# Patient Record
Sex: Female | Born: 1935 | Race: White | Hispanic: No | Smoking: Former smoker
Health system: Southern US, Community
[De-identification: ages and names within clinical notes are randomized; demographics above are authoritative.]

## PROBLEM LIST (undated history)

## (undated) DIAGNOSIS — M519 Unspecified thoracic, thoracolumbar and lumbosacral intervertebral disc disorder: Secondary | ICD-10-CM

## (undated) DIAGNOSIS — M779 Enthesopathy, unspecified: Secondary | ICD-10-CM

## (undated) DIAGNOSIS — C801 Malignant (primary) neoplasm, unspecified: Secondary | ICD-10-CM

## (undated) DIAGNOSIS — I10 Essential (primary) hypertension: Secondary | ICD-10-CM

## (undated) DIAGNOSIS — F419 Anxiety disorder, unspecified: Secondary | ICD-10-CM

## (undated) DIAGNOSIS — F32A Depression, unspecified: Secondary | ICD-10-CM

## (undated) DIAGNOSIS — F329 Major depressive disorder, single episode, unspecified: Secondary | ICD-10-CM

## (undated) DIAGNOSIS — E039 Hypothyroidism, unspecified: Secondary | ICD-10-CM

## (undated) DIAGNOSIS — C4491 Basal cell carcinoma of skin, unspecified: Secondary | ICD-10-CM

## (undated) HISTORY — PX: TMJ ARTHROPLASTY: SHX1066

## (undated) HISTORY — PX: TONSILLECTOMY: SUR1361

---

## 2014-07-16 ENCOUNTER — Emergency Department (HOSPITAL_COMMUNITY)
Admission: EM | Admit: 2014-07-16 | Discharge: 2014-07-18 | Disposition: A | Discharge status: Home / Self Care | Payer: Medicare Other | Attending: Emergency Medicine | Admitting: Emergency Medicine

## 2014-07-16 ENCOUNTER — Encounter (HOSPITAL_COMMUNITY): Payer: Self-pay | Admitting: Emergency Medicine

## 2014-07-16 DIAGNOSIS — Z859 Personal history of malignant neoplasm, unspecified: Secondary | ICD-10-CM | POA: Insufficient documentation

## 2014-07-16 DIAGNOSIS — Z85828 Personal history of other malignant neoplasm of skin: Secondary | ICD-10-CM | POA: Insufficient documentation

## 2014-07-16 DIAGNOSIS — Z87891 Personal history of nicotine dependence: Secondary | ICD-10-CM | POA: Diagnosis not present

## 2014-07-16 DIAGNOSIS — R4182 Altered mental status, unspecified: Secondary | ICD-10-CM

## 2014-07-16 DIAGNOSIS — Z8739 Personal history of other diseases of the musculoskeletal system and connective tissue: Secondary | ICD-10-CM | POA: Insufficient documentation

## 2014-07-16 DIAGNOSIS — F0391 Unspecified dementia with behavioral disturbance: Secondary | ICD-10-CM | POA: Diagnosis not present

## 2014-07-16 DIAGNOSIS — Z049 Encounter for examination and observation for unspecified reason: Secondary | ICD-10-CM

## 2014-07-16 DIAGNOSIS — F29 Unspecified psychosis not due to a substance or known physiological condition: Secondary | ICD-10-CM | POA: Insufficient documentation

## 2014-07-16 DIAGNOSIS — F03918 Unspecified dementia, unspecified severity, with other behavioral disturbance: Secondary | ICD-10-CM | POA: Diagnosis present

## 2014-07-16 DIAGNOSIS — R443 Hallucinations, unspecified: Secondary | ICD-10-CM | POA: Diagnosis present

## 2014-07-16 HISTORY — DX: Basal cell carcinoma of skin, unspecified: C44.91

## 2014-07-16 HISTORY — DX: Enthesopathy, unspecified: M77.9

## 2014-07-16 HISTORY — DX: Unspecified thoracic, thoracolumbar and lumbosacral intervertebral disc disorder: M51.9

## 2014-07-16 HISTORY — DX: Hypothyroidism, unspecified: E03.9

## 2014-07-16 HISTORY — DX: Malignant (primary) neoplasm, unspecified: C80.1

## 2014-07-16 HISTORY — DX: Anxiety disorder, unspecified: F41.9

## 2014-07-16 HISTORY — DX: Major depressive disorder, single episode, unspecified: F32.9

## 2014-07-16 HISTORY — DX: Essential (primary) hypertension: I10

## 2014-07-16 HISTORY — DX: Depression, unspecified: F32.A

## 2014-07-16 LAB — COMPREHENSIVE METABOLIC PANEL
ALBUMIN: 3.6 g/dL (ref 3.5–5.2)
ALT: 9 U/L (ref 0–35)
AST: 12 U/L (ref 0–37)
Alkaline Phosphatase: 68 U/L (ref 39–117)
Anion gap: 14 (ref 5–15)
BILIRUBIN TOTAL: 0.2 mg/dL — AB (ref 0.3–1.2)
BUN: 26 mg/dL — AB (ref 6–23)
CALCIUM: 9.3 mg/dL (ref 8.4–10.5)
CHLORIDE: 98 meq/L (ref 96–112)
CO2: 21 mEq/L (ref 19–32)
CREATININE: 0.95 mg/dL (ref 0.50–1.10)
GFR calc Af Amer: 64 mL/min — ABNORMAL LOW (ref >90)
GFR calc non Af Amer: 55 mL/min — ABNORMAL LOW (ref >90)
Glucose, Bld: 121 mg/dL — ABNORMAL HIGH (ref 70–99)
Potassium: 4.3 mEq/L (ref 3.7–5.3)
Sodium: 133 mEq/L — ABNORMAL LOW (ref 137–147)
Total Protein: 6.9 g/dL (ref 6.0–8.3)

## 2014-07-16 LAB — CBC WITH DIFFERENTIAL/PLATELET
BASOS PCT: 1 % (ref 0–1)
Basophils Absolute: 0.1 10*3/uL (ref 0.0–0.1)
EOS ABS: 0.2 10*3/uL (ref 0.0–0.7)
EOS PCT: 2 % (ref 0–5)
HEMATOCRIT: 40.2 % (ref 36.0–46.0)
Hemoglobin: 13.8 g/dL (ref 12.0–15.0)
Lymphocytes Relative: 17 % (ref 12–46)
Lymphs Abs: 1.7 10*3/uL (ref 0.7–4.0)
MCH: 29.9 pg (ref 26.0–34.0)
MCHC: 34.3 g/dL (ref 30.0–36.0)
MCV: 87.2 fL (ref 78.0–100.0)
MONO ABS: 1.1 10*3/uL — AB (ref 0.1–1.0)
MONOS PCT: 11 % (ref 3–12)
NEUTROS ABS: 7.1 10*3/uL (ref 1.7–7.7)
Neutrophils Relative %: 69 % (ref 43–77)
Platelets: 359 10*3/uL (ref 150–400)
RBC: 4.61 MIL/uL (ref 3.87–5.11)
RDW: 12.5 % (ref 11.5–15.5)
WBC: 10.1 10*3/uL (ref 4.0–10.5)

## 2014-07-16 LAB — ETHANOL: Alcohol, Ethyl (B): <11 mg/dL (ref 0–11)

## 2014-07-16 MED ORDER — MELATONIN 3 MG PO TABS: 1.0000 | ORAL_TABLET | Freq: Every day | ORAL | Status: DC

## 2014-07-16 MED ORDER — DICLOFENAC SODIUM 75 MG PO TBEC
75.0000 mg | DELAYED_RELEASE_TABLET | Freq: Two times a day (BID) | ORAL | Status: DC
  Administered 2014-07-16 – 2014-07-18 (×4): 75 mg via ORAL
  Filled 2014-07-16 (×5): qty 1

## 2014-07-16 MED ORDER — METOPROLOL TARTRATE 25 MG PO TABS
100.0000 mg | ORAL_TABLET | Freq: Every day | ORAL | Status: DC
  Administered 2014-07-17 – 2014-07-18 (×2): 100 mg via ORAL
  Filled 2014-07-16 (×2): qty 4

## 2014-07-16 MED ORDER — SIMVASTATIN 40 MG PO TABS
40.0000 mg | ORAL_TABLET | Freq: Every day | ORAL | Status: DC
  Administered 2014-07-17 – 2014-07-18 (×2): 40 mg via ORAL
  Filled 2014-07-16 (×2): qty 1

## 2014-07-16 MED ORDER — FERROUS SULFATE 325 (65 FE) MG PO TABS
325.0000 mg | ORAL_TABLET | Freq: Two times a day (BID) | ORAL | Status: DC
  Administered 2014-07-17 – 2014-07-18 (×3): 325 mg via ORAL
  Filled 2014-07-16 (×5): qty 1

## 2014-07-16 MED ORDER — CYCLOSPORINE 0.05 % OP EMUL
1.0000 [drp] | Freq: Two times a day (BID) | OPHTHALMIC | Status: DC
  Administered 2014-07-16 – 2014-07-18 (×4): 1 [drp] via OPHTHALMIC
  Filled 2014-07-16 (×5): qty 1

## 2014-07-16 MED ORDER — CITALOPRAM HYDROBROMIDE 20 MG PO TABS
20.0000 mg | ORAL_TABLET | Freq: Every day | ORAL | Status: DC
  Administered 2014-07-17 – 2014-07-18 (×2): 20 mg via ORAL
  Filled 2014-07-16 (×2): qty 1

## 2014-07-16 MED ORDER — LEVOTHYROXINE SODIUM 50 MCG PO TABS
50.0000 ug | ORAL_TABLET | Freq: Every day | ORAL | Status: DC
  Administered 2014-07-17 – 2014-07-18 (×2): 50 ug via ORAL
  Filled 2014-07-16 (×3): qty 1

## 2014-07-16 MED ORDER — PANTOPRAZOLE SODIUM 40 MG PO TBEC
40.0000 mg | DELAYED_RELEASE_TABLET | Freq: Every day | ORAL | Status: DC
  Administered 2014-07-17 – 2014-07-18 (×2): 40 mg via ORAL
  Filled 2014-07-16 (×2): qty 1

## 2014-07-16 MED ORDER — ASPIRIN 325 MG PO TABS
325.0000 mg | ORAL_TABLET | Freq: Every day | ORAL | Status: DC
  Administered 2014-07-17 – 2014-07-18 (×2): 325 mg via ORAL
  Filled 2014-07-16 (×2): qty 1

## 2014-07-16 MED ORDER — RISPERIDONE 0.5 MG PO TABS
0.2500 mg | ORAL_TABLET | Freq: Two times a day (BID) | ORAL | Status: DC
  Administered 2014-07-16 – 2014-07-18 (×4): 0.25 mg via ORAL
  Filled 2014-07-16 (×4): qty 1

## 2014-07-16 NOTE — ED Notes (Signed)
Pt resting on stretcher at present, daughter just completed visit.  Pt awake, alert & responsive, no distress noted, will continue to monitor for safety.

## 2014-07-16 NOTE — Progress Notes (Signed)
PHARMACIST - PHYSICIAN ORDER COMMUNICATION  CONCERNING: P&T Medication Policy on Herbal Medications  DESCRIPTION:  This patient's order for:  melatonin has been noted.  This product(s) is classified as an "herbal" or natural product. Due to a lack of definitive safety studies or FDA approval, nonstandard manufacturing practices, plus the potential risk of unknown drug-drug interactions while on inpatient medications, the Pharmacy and Therapeutics Committee does not permit the use of "herbal" or natural products of this type within [facility].   ACTION TAKEN: The pharmacy department is unable to verify this order at this time and your patient has been informed of this safety policy. Please reevaluate patient's clinical condition at discharge and address if the herbal or natural product(s) should be resumed at that time.   Doreene Eland, PharmD, BCPS.   Pager: [phone] 07/16/2014 6:26 PM

## 2014-07-16 NOTE — Progress Notes (Addendum)
  CARE MANAGEMENT ED NOTE 07/16/2014  Patient:  Sarah Acevedo, Sarah Acevedo   Account Number:  [id number]  Date Initiated:  07/16/2014  Documentation initiated by:  Jackelyn Poling  Subjective/Objective Assessment:   80 yr old [place] Pt's daughter states pt is delusional and having auditory hallucinations starting this past summer, since which behavior has changed steadily, voices are causing constant fear.      Subjective/Objective Assessment Detail:   Pt was in ED in [place] last weekend because she was knocking on neighbors' doors telling neighbors her siblings had been killed in a car crash, ED told daughter that no organic cause was found for patient's behavior and ED recommended inpatient psych treatment. Pt's family member wanted to bring pt home, and did, took pt to PCP today, PCP recommended patient be taken to ED.    no pcp listed in EPIC but pt and daughter at bedside states pt saw Dr Stephanie Acre at [facility] [place] 07/16/14  pcp in [place] is [facility] in [place]  Pt was seen by [facility] provider in [place] recently  Pt has a neurologist in [place] who she sees for her aneurysms  [phone]  Daughter reports     Action/Plan:   ED CM spoke with pt and daughter at bedside Pt pleasant and cooperative Cm review medical clearance program processAnswered daughter questions about why pt requested to wear paper scrubs and gripper socks for her protection & staff. she   Action/Plan Detail:   voiced understanding EPIC updated   Anticipated DC Date:  07/16/2014     Status Recommendation to Physician:   Result of Recommendation:    Other ED [place]  Other  Outpatient Services - Pt will follow up  PCP issues    Choice offered to / List presented to:            Status of service:  Completed, signed off  ED Comments:   ED Comments Detail:     Neurologist is at [phone] and pt reports a new neurologist  assigned in [place] "closer"

## 2014-07-16 NOTE — ED Provider Notes (Signed)
CSN: [id number]     Arrival date & time 07/16/14  1409 History   First MD Initiated Contact with Patient 07/16/14 1524     Chief Complaint  Patient presents with  . Hallucinations   . Delusions      HPI  Patient presents with her daughter who provides much of the history of present illness. Patient herself denies physical complaints, does acknowledge paranoia. Patient was generally well until approximately 6 months ago.  Since about that time the patient has hadincreasingly frequent outbursts of verbal aggression, as well his changes consistent with paranoia. Patient frequently describes in the distal screw are not present, but seemingly threatening her. She also describes interactions with dead people. She has been hospitalized twice in the past month for these concerns, both times in [place]. The most recent evaluation was thorough, according to family members, and the patient was attempting to be placed in a facility. Family members live locally, and the patient is here now for additional evaluation. Level V caveat secondary to psychiatric condition.  Past Medical History  Diagnosis Date  . Cancer   . Ruptured disk   . Bone spur   . Basal cell carcinoma    Past Surgical History  Procedure Laterality Date  . Tmj arthroplasty    . Cesarean section    . Tonsillectomy     History reviewed. No pertinent family history. History  Substance Use Topics  . Smoking status: Former Research scientist (life sciences)  . Smokeless tobacco: Not on file  . Alcohol Use: No   OB History    No data available     Review of Systems  Unable to perform ROS: Psychiatric disorder      Allergies  Contrast media and Iodine  Home Medications   Prior to Admission medications   Not on File   BP 99/75 mmHg  Pulse 79  Temp(Src) 97.6 F (36.4 C) (Oral)  Resp 16  SpO2 98% Physical Exam  Constitutional: She is oriented to person, place, and time. She appears well-developed and well-nourished. No distress.   HENT:  Head: Normocephalic and atraumatic.  Eyes: Conjunctivae and EOM are normal.  Cardiovascular: Normal rate and regular rhythm.   Pulmonary/Chest: Effort normal and breath sounds normal. No stridor. No respiratory distress.  Abdominal: She exhibits no distension.  Musculoskeletal: She exhibits no edema.  Neurological: She is alert and oriented to person, place, and time. No cranial nerve deficit.  Skin: Skin is warm and dry.  Psychiatric: Her mood appears anxious. Her speech is delayed and tangential. She is withdrawn and actively hallucinating. Thought content is paranoid and delusional. Cognition and memory are impaired.  Nursing note and vitals reviewed.   ED Course  Procedures (including critical care time) Labs Review Labs Reviewed  CBC WITH DIFFERENTIAL - Abnormal; Notable for the following:    Monocytes Absolute 1.1 (*)    All other components within normal limits  COMPREHENSIVE METABOLIC PANEL - Abnormal; Notable for the following:    Sodium 133 (*)    Glucose, Bld 121 (*)    BUN 26 (*)    Total Bilirubin 0.2 (*)    GFR calc non Af Amer 55 (*)    GFR calc Af Amer 64 (*)    All other components within normal limits  ETHANOL  URINALYSIS, ROUTINE W REFLEX MICROSCOPIC    This elderly female presents to 2 familial concerns of delusional thoughts, paranoid behavior, as well as aggressive outbursts against family members.  After the initial evaluation we attempted  to obtain outside hospital records.  MDM  Well-appearing elderly female presents with new paranoid delusions, aggressive behavior. Patient is hemodynamically stable, awake and alert.  Patient has little insight into her behavior, which is described by family member. Patient was medically cleared for further evaluation and management by our psychiatry team. On transfer to our psychiatry holding area, the primary care physician was trying to obtain the patient's outside hospital records.    Carmin Muskrat,  MD 07/16/14 Tresa Moore
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
## 2014-07-16 NOTE — ED Notes (Signed)
Bed: WBH43 Expected date:  Expected time:  Means of arrival:  Comments: TR4 

## 2014-07-16 NOTE — Consult Note (Signed)
[facility] Face-to-Face Psychiatry Consult   Reason for Consult:  Psychosis  Referring Physician:  EDP Sarah Acevedo is an 80 y.o. female. Total Time spent with patient: 20 minutes  Assessment: AXIS I:  Psychotic Disorder NOS AXIS II:  Deferred AXIS III:   Past Medical History  Diagnosis Date  . Cancer   . Ruptured disk   . Bone spur   . Basal cell carcinoma   . Hypothyroidism   . Depression   . Anxiety   . Hypertension    AXIS IV:  other psychosocial or environmental problems and problems related to social environment AXIS V:  21-30 behavior considerably influenced by delusions or hallucinations OR serious impairment in judgment, communication OR inability to function in almost all areas  Plan:  Recommend psychiatric Inpatient admission when medically cleared.  Subjective:   Sarah Acevedo is a 80 y.o. female patient admitted with acute psychosis .  HPI:  Sarah Acevedo is a 80 year old caucasian female who presents to the emergency department with acute psychosis.  Patient is extremely paranoid and was secluding herself in her room due to fears that someone was in her home.  Patient endorses hearing voices telling her that people are dead.  Patient is gradiose as well as paranoid believing "my daughter spent 2 weeks with Sarah Acevedo"  Patient is concerned that her son in law is dead, and believes her grand daughter is in the intensive care unit where she was dead but brought back to life.  Patient has a long history of depression and has been hospitalized in the past for suicidal ideation.  Patient denies current suicidal ideation or homicidal ideation/ no substance abuse.  Patient was in the emergency department in [place] 1 week ago for similar symptoms and daughter states that no organic cause was found for the above symptoms.  Patient's mental status has been declining over the past six months, daughter brought patient to [place] to provide increased supervision and to find  psychiatric treatment for patient closer to her home.   HPI Elements:   Location:  generalized. Quality:  acute. Severity:  severe. Timing:  acute . Duration:  declining over past 6 months with acute exacerbation over past few weeks. Context:  stressors.  Past Psychiatric History: Past Medical History  Diagnosis Date  . Cancer   . Ruptured disk   . Bone spur   . Basal cell carcinoma   . Hypothyroidism   . Depression   . Anxiety   . Hypertension     reports that she has quit smoking. She does not have any smokeless tobacco history on file. She reports that she does not drink alcohol or use illicit drugs. History reviewed. No pertinent family history.         Allergies:   Allergies  Allergen Reactions  . Contrast Media [Iodinated Diagnostic Agents] Hives  . Iodine Hives    ACT Assessment Complete:  Yes:    Educational Status    Risk to Self: Risk to self with the past 6 months Is patient at risk for suicide?: No Substance abuse history and/or treatment for substance abuse?: No  Risk to Others:    Abuse:    Prior Inpatient Therapy:    Prior Outpatient Therapy:    Additional Information:                    Objective: Blood pressure 99/75, pulse 79, temperature 97.6 F (36.4 C), temperature source Oral, resp. rate 16, SpO2  98 %.There is no height or weight on file to calculate BMI. Results for orders placed or performed during the hospital encounter of 07/16/14 (from the past 72 hour(s))  CBC with Differential     Status: Abnormal   Collection Time: 07/16/14  4:13 PM  Result Value Ref Range   WBC 10.1 4.0 - 10.5 K/uL   RBC 4.61 3.87 - 5.11 MIL/uL   Hemoglobin 13.8 12.0 - 15.0 g/dL   HCT 40.2 36.0 - 46.0 %   MCV 87.2 78.0 - 100.0 fL   MCH 29.9 26.0 - 34.0 pg   MCHC 34.3 30.0 - 36.0 g/dL   RDW 12.5 11.5 - 15.5 %   Platelets 359 150 - 400 K/uL   Neutrophils Relative % 69 43 - 77 %   Neutro Abs 7.1 1.7 - 7.7 K/uL   Lymphocytes Relative 17 12 - 46 %    Lymphs Abs 1.7 0.7 - 4.0 K/uL   Monocytes Relative 11 3 - 12 %   Monocytes Absolute 1.1 (H) 0.1 - 1.0 K/uL   Eosinophils Relative 2 0 - 5 %   Eosinophils Absolute 0.2 0.0 - 0.7 K/uL   Basophils Relative 1 0 - 1 %   Basophils Absolute 0.1 0.0 - 0.1 K/uL  Comprehensive metabolic panel     Status: Abnormal   Collection Time: 07/16/14  4:13 PM  Result Value Ref Range   Sodium 133 (L) 137 - 147 mEq/L   Potassium 4.3 3.7 - 5.3 mEq/L   Chloride 98 96 - 112 mEq/L   CO2 21 19 - 32 mEq/L   Glucose, Bld 121 (H) 70 - 99 mg/dL   BUN 26 (H) 6 - 23 mg/dL   Creatinine, Ser 0.95 0.50 - 1.10 mg/dL   Calcium 9.3 8.4 - 10.5 mg/dL   Total Protein 6.9 6.0 - 8.3 g/dL   Albumin 3.6 3.5 - 5.2 g/dL   AST 12 0 - 37 U/L   ALT 9 0 - 35 U/L   Alkaline Phosphatase 68 39 - 117 U/L   Total Bilirubin 0.2 (L) 0.3 - 1.2 mg/dL   GFR calc non Af Amer 55 (L) >90 mL/min   GFR calc Af Amer 64 (L) >90 mL/min    Comment: (NOTE) The eGFR has been calculated using the CKD EPI equation. This calculation has not been validated in all clinical situations. eGFR's persistently <90 mL/min signify possible Chronic Kidney Disease.    Anion gap 14 5 - 15  Ethanol     Status: None   Collection Time: 07/16/14  4:13 PM  Result Value Ref Range   Alcohol, Ethyl (B) <11 0 - 11 mg/dL    Comment:        LOWEST DETECTABLE LIMIT FOR SERUM ALCOHOL IS 11 mg/dL FOR MEDICAL PURPOSES ONLY    Labs are reviewed and are pertinent for medical issues being treated.  Current Facility-Administered Medications  Medication Dose Route Frequency Provider Last Rate Last Dose  . aspirin tablet 325 mg  325 mg Oral Daily Carmin Muskrat, MD      . Derrill Memo ON 07/17/2014] citalopram (CELEXA) tablet 20 mg  20 mg Oral Daily Carmin Muskrat, MD      . cycloSPORINE (RESTASIS) 0.05 % ophthalmic emulsion 1 drop  1 drop Both Eyes BID Carmin Muskrat, MD      . diclofenac (VOLTAREN) EC tablet 75 mg  75 mg Oral BID Carmin Muskrat, MD      . Derrill Memo ON  07/17/2014] ferrous sulfate tablet  325 mg  325 mg Oral BID WC Carmin Muskrat, MD      . Derrill Memo ON 07/17/2014] levothyroxine (SYNTHROID, LEVOTHROID) tablet 50 mcg  50 mcg Oral QAC breakfast Carmin Muskrat, MD      . metoprolol tartrate (LOPRESSOR) tablet 100 mg  100 mg Oral Daily Carmin Muskrat, MD      . pantoprazole (PROTONIX) EC tablet 40 mg  40 mg Oral Daily Carmin Muskrat, MD      . risperiDONE (RISPERDAL) tablet 0.25 mg  0.25 mg Oral BID Carmin Muskrat, MD      . Derrill Memo ON 07/17/2014] simvastatin (ZOCOR) tablet 40 mg  40 mg Oral Daily Carmin Muskrat, MD       Current Outpatient Prescriptions  Medication Sig Dispense Refill  . aspirin 325 MG tablet Take 325 mg by mouth daily.    Marland Kitchen atorvastatin (LIPITOR) 40 MG tablet Take 40 mg by mouth at bedtime.    . citalopram (CELEXA) 20 MG tablet Take 20 mg by mouth daily.    . cycloSPORINE (RESTASIS) 0.05 % ophthalmic emulsion Place 1 drop into both eyes 2 (two) times daily.    . diclofenac (VOLTAREN) 75 MG EC tablet Take 75 mg by mouth 2 (two) times daily.    . ferrous sulfate 325 (65 FE) MG tablet Take 325 mg by mouth 2 (two) times daily with a meal.    . levothyroxine (SYNTHROID, LEVOTHROID) 50 MCG tablet Take 50 mcg by mouth daily.    . Melatonin 3 MG TABS Take 1 tablet by mouth at bedtime.    . metoprolol (LOPRESSOR) 100 MG tablet Take 100 mg by mouth daily.    . Multiple Vitamins-Minerals (ICAPS PO) Take 1 capsule by mouth daily.    Marland Kitchen omeprazole (PRILOSEC) 20 MG capsule Take 20 mg by mouth daily.    . risperiDONE (RISPERDAL) 0.25 MG tablet Take 0.25 mg by mouth 2 (two) times daily.    . simvastatin (ZOCOR) 40 MG tablet Take 40 mg by mouth daily.      Psychiatric Specialty Exam:     Blood pressure 99/75, pulse 79, temperature 97.6 F (36.4 C), temperature source Oral, resp. rate 16, SpO2 98 %.There is no height or weight on file to calculate BMI.  General Appearance: Casual  Eye Contact::  Fair  Speech:  Clear and Coherent   Volume:  Normal  Mood:  Irritable  Affect:  Congruent  Thought Process:  Intact and Tangential  Orientation:  Full (Time, Place, and Person)  Thought Content:  Delusions and Hallucinations: Auditory Visual  Suicidal Thoughts:  No  Homicidal Thoughts:  No  Memory:  Immediate;   Poor Recent;   Poor Remote;   Poor  Judgement:  Impaired  Insight:  Lacking  Psychomotor Activity:  Normal  Concentration:  Poor  Recall:  Poor  Fund of Knowledge:Fair  Language: Good  Akathisia:  No  Handed:  Right  AIMS (if indicated):     Assets:  Housing Social Support  Sleep:      Musculoskeletal: Strength & Muscle Tone: within normal limits Gait & Station: normal Patient leans: N/A  Treatment Plan Summary: Daily contact with patient to assess and evaluate symptoms and progress in treatment Medication management  Admit to inpatient geriatric psychiatric unit when bed available   Kennedy Bucker PMHNP-BC 07/16/2014 7:02 PM  Patient seen, evaluated and I agree with notes by Nurse Practitioner. Corena Pilgrim, MD

## 2014-07-16 NOTE — ED Notes (Signed)
Pt's daughter states pt is delusional and having auditory hallucinations starting this past summer, since which behavior has changed steadily, voices are causing constant fear.  Pt was in ED in [place] last weekend because she was knocking on neighbors' doors telling neighbors her siblings had been killed in a car crash, ED told daughter that no organic cause was found for patient's behavior and ED reccommended inpatient psych treatment. Pt's family member wanted to bring pt home, and did, took pt to PCP today, PCP recommended patient be taken to ED.

## 2014-07-17 ENCOUNTER — Encounter (HOSPITAL_COMMUNITY): Payer: Self-pay | Admitting: Psychiatry

## 2014-07-17 ENCOUNTER — Emergency Department (HOSPITAL_COMMUNITY): Payer: Medicare Other

## 2014-07-17 ENCOUNTER — Other Ambulatory Visit: Payer: Self-pay

## 2014-07-17 DIAGNOSIS — F29 Unspecified psychosis not due to a substance or known physiological condition: Secondary | ICD-10-CM

## 2014-07-17 DIAGNOSIS — F0391 Unspecified dementia with behavioral disturbance: Secondary | ICD-10-CM | POA: Diagnosis not present

## 2014-07-17 LAB — URINALYSIS, ROUTINE W REFLEX MICROSCOPIC
Bilirubin Urine: NEGATIVE
Glucose, UA: NEGATIVE mg/dL
Hgb urine dipstick: NEGATIVE
KETONES UR: NEGATIVE mg/dL
NITRITE: NEGATIVE
PH: 5.5 (ref 5.0–8.0)
Protein, ur: NEGATIVE mg/dL
Specific Gravity, Urine: 1.017 (ref 1.005–1.030)
Urobilinogen, UA: 0.2 mg/dL (ref 0.0–1.0)

## 2014-07-17 LAB — URINE MICROSCOPIC-ADD ON

## 2014-07-17 MED ORDER — OLANZAPINE 5 MG PO TBDP
5.0000 mg | ORAL_TABLET | Freq: Every day | ORAL | Status: DC
  Administered 2014-07-17 (×2): 5 mg via ORAL
  Filled 2014-07-17 (×2): qty 1

## 2014-07-17 MED ORDER — CEPHALEXIN 500 MG PO CAPS
500.0000 mg | ORAL_CAPSULE | Freq: Three times a day (TID) | ORAL | Status: DC
  Administered 2014-07-17 – 2014-07-18 (×4): 500 mg via ORAL
  Filled 2014-07-17 (×4): qty 1

## 2014-07-17 NOTE — ED Notes (Signed)
Pt sitting at front desk at present, no distress noted, will continue to monitor for safety.

## 2014-07-17 NOTE — Progress Notes (Signed)
Pt has been assessed and meets inpatient criteria. Pt.'s clinical faxed to:  [facility]  Will continue to seek placement.  Charlene Brooke, MSW  Social Worker [phone]

## 2014-07-17 NOTE — BHH Counselor (Signed)
Writer spoke w/ pt's daughter Milus Mallick [phone]. Writer updated daughter on dispostion.   Arnold Long, [place] Assessment Counselor
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
## 2014-07-17 NOTE — Consult Note (Signed)
[facility] Face-to-Face Psychiatry Consult   Reason for Consult:  Psychosis  Referring Physician:  EDP Sarah Acevedo is an 80 y.o. female. Total Time spent with patient: 30 minutes  Assessment: AXIS I:  Psychotic Disorder NOS AXIS II:  Deferred AXIS III:   Past Medical History  Diagnosis Date  . Cancer   . Ruptured disk   . Bone spur   . Basal cell carcinoma   . Hypothyroidism   . Depression   . Anxiety   . Hypertension    AXIS IV:  other psychosocial or environmental problems and problems related to social environment AXIS V:  21-30 behavior considerably influenced by delusions or hallucinations OR serious impairment in judgment, communication OR inability to function in almost all areas  Plan:  Recommend psychiatric Inpatient admission when medically cleared. Transfer to [facility]  Subjective:   Sarah Acevedo is a 80 y.o. female patient admitted with acute psychosis .  HPI:  Sarah Acevedo continues to be psychotic with delusions that everyone around her are dead.  Additional medical tests ordered, accepted to [facility] for stabilization. HPI Elements:   Location:  generalized. Quality:  acute. Severity:  severe. Timing:  acute . Duration:  declining over past 6 months with acute exacerbation over past few weeks. Context:  stressors.  Past Psychiatric History: Past Medical History  Diagnosis Date  . Cancer   . Ruptured disk   . Bone spur   . Basal cell carcinoma   . Hypothyroidism   . Depression   . Anxiety   . Hypertension     reports that she has quit smoking. She does not have any smokeless tobacco history on file. She reports that she does not drink alcohol or use illicit drugs. History reviewed. No pertinent family history.         Allergies:   Allergies  Allergen Reactions  . Contrast Media [Iodinated Diagnostic Agents] Hives  . Iodine Hives    ACT Assessment Complete:  Yes:    Educational Status    Risk to Self: Risk to self with the past 6  months Is patient at risk for suicide?: No Substance abuse history and/or treatment for substance abuse?: No  Risk to Others:    Abuse:    Prior Inpatient Therapy:    Prior Outpatient Therapy:    Additional Information:                    Objective: Blood pressure 116/65, pulse 73, temperature 97.9 F (36.6 C), temperature source Oral, resp. rate 20, SpO2 100 %.There is no height or weight on file to calculate BMI. Results for orders placed or performed during the hospital encounter of 07/16/14 (from the past 72 hour(s))  CBC with Differential     Status: Abnormal   Collection Time: 07/16/14  4:13 PM  Result Value Ref Range   WBC 10.1 4.0 - 10.5 K/uL   RBC 4.61 3.87 - 5.11 MIL/uL   Hemoglobin 13.8 12.0 - 15.0 g/dL   HCT 40.2 36.0 - 46.0 %   MCV 87.2 78.0 - 100.0 fL   MCH 29.9 26.0 - 34.0 pg   MCHC 34.3 30.0 - 36.0 g/dL   RDW 12.5 11.5 - 15.5 %   Platelets 359 150 - 400 K/uL   Neutrophils Relative % 69 43 - 77 %   Neutro Abs 7.1 1.7 - 7.7 K/uL   Lymphocytes Relative 17 12 - 46 %   Lymphs Abs 1.7 0.7 - 4.0 K/uL  Monocytes Relative 11 3 - 12 %   Monocytes Absolute 1.1 (H) 0.1 - 1.0 K/uL   Eosinophils Relative 2 0 - 5 %   Eosinophils Absolute 0.2 0.0 - 0.7 K/uL   Basophils Relative 1 0 - 1 %   Basophils Absolute 0.1 0.0 - 0.1 K/uL  Comprehensive metabolic panel     Status: Abnormal   Collection Time: 07/16/14  4:13 PM  Result Value Ref Range   Sodium 133 (L) 137 - 147 mEq/L   Potassium 4.3 3.7 - 5.3 mEq/L   Chloride 98 96 - 112 mEq/L   CO2 21 19 - 32 mEq/L   Glucose, Bld 121 (H) 70 - 99 mg/dL   BUN 26 (H) 6 - 23 mg/dL   Creatinine, Ser 0.95 0.50 - 1.10 mg/dL   Calcium 9.3 8.4 - 10.5 mg/dL   Total Protein 6.9 6.0 - 8.3 g/dL   Albumin 3.6 3.5 - 5.2 g/dL   AST 12 0 - 37 U/L   ALT 9 0 - 35 U/L   Alkaline Phosphatase 68 39 - 117 U/L   Total Bilirubin 0.2 (L) 0.3 - 1.2 mg/dL   GFR calc non Af Amer 55 (L) >90 mL/min   GFR calc Af Amer 64 (L) >90 mL/min     Comment: (NOTE) The eGFR has been calculated using the CKD EPI equation. This calculation has not been validated in all clinical situations. eGFR's persistently <90 mL/min signify possible Chronic Kidney Disease.    Anion gap 14 5 - 15  Ethanol     Status: None   Collection Time: 07/16/14  4:13 PM  Result Value Ref Range   Alcohol, Ethyl (B) <11 0 - 11 mg/dL    Comment:        LOWEST DETECTABLE LIMIT FOR SERUM ALCOHOL IS 11 mg/dL FOR MEDICAL PURPOSES ONLY    Labs are reviewed and are pertinent for medical issues being treated.  Current Facility-Administered Medications  Medication Dose Route Frequency Provider Last Rate Last Dose  . aspirin tablet 325 mg  325 mg Oral Daily Carmin Muskrat, MD   325 mg at 07/17/14 0907  . citalopram (CELEXA) tablet 20 mg  20 mg Oral Daily Carmin Muskrat, MD   20 mg at 07/17/14 0907  . cycloSPORINE (RESTASIS) 0.05 % ophthalmic emulsion 1 drop  1 drop Both Eyes BID Carmin Muskrat, MD   1 drop at 07/17/14 0908  . diclofenac (VOLTAREN) EC tablet 75 mg  75 mg Oral BID Carmin Muskrat, MD   75 mg at 07/17/14 0908  . ferrous sulfate tablet 325 mg  325 mg Oral BID WC Carmin Muskrat, MD   325 mg at 07/17/14 0907  . levothyroxine (SYNTHROID, LEVOTHROID) tablet 50 mcg  50 mcg Oral QAC breakfast Carmin Muskrat, MD   50 mcg at 07/17/14 0907  . metoprolol tartrate (LOPRESSOR) tablet 100 mg  100 mg Oral Daily Carmin Muskrat, MD   100 mg at 07/17/14 0907  . OLANZapine zydis (ZYPREXA) disintegrating tablet 5 mg  5 mg Oral QHS Merryl Hacker, MD   5 mg at 07/17/14 0142  . pantoprazole (PROTONIX) EC tablet 40 mg  40 mg Oral Daily Carmin Muskrat, MD   40 mg at 07/17/14 0907  . risperiDONE (RISPERDAL) tablet 0.25 mg  0.25 mg Oral BID Carmin Muskrat, MD   0.25 mg at 07/17/14 0908  . simvastatin (ZOCOR) tablet 40 mg  40 mg Oral Daily Carmin Muskrat, MD   40 mg at 07/17/14 [phone]  Current Outpatient Prescriptions  Medication Sig Dispense Refill  . aspirin 325 MG  tablet Take 325 mg by mouth daily.    Marland Kitchen atorvastatin (LIPITOR) 40 MG tablet Take 40 mg by mouth at bedtime.    . citalopram (CELEXA) 20 MG tablet Take 20 mg by mouth daily.    . cycloSPORINE (RESTASIS) 0.05 % ophthalmic emulsion Place 1 drop into both eyes 2 (two) times daily.    . diclofenac (VOLTAREN) 75 MG EC tablet Take 75 mg by mouth 2 (two) times daily.    . ferrous sulfate 325 (65 FE) MG tablet Take 325 mg by mouth 2 (two) times daily with a meal.    . levothyroxine (SYNTHROID, LEVOTHROID) 50 MCG tablet Take 50 mcg by mouth daily.    . Melatonin 3 MG TABS Take 1 tablet by mouth at bedtime.    . metoprolol (LOPRESSOR) 100 MG tablet Take 100 mg by mouth daily.    . Multiple Vitamins-Minerals (ICAPS PO) Take 1 capsule by mouth daily.    Marland Kitchen omeprazole (PRILOSEC) 20 MG capsule Take 20 mg by mouth daily.    . risperiDONE (RISPERDAL) 0.25 MG tablet Take 0.25 mg by mouth 2 (two) times daily.    . simvastatin (ZOCOR) 40 MG tablet Take 40 mg by mouth daily.      Psychiatric Specialty Exam:     Blood pressure 116/65, pulse 73, temperature 97.9 F (36.6 C), temperature source Oral, resp. rate 20, SpO2 100 %.There is no height or weight on file to calculate BMI.  General Appearance: Casual  Eye Contact::  Fair  Speech:  Clear and Coherent  Volume:  Normal  Mood:  Irritable  Affect:  Congruent  Thought Process:  Intact and Tangential  Orientation:  Full (Time, Place, and Person)  Thought Content:  Delusions and Hallucinations: Auditory Visual  Suicidal Thoughts:  No  Homicidal Thoughts:  No  Memory:  Immediate;   Poor Recent;   Poor Remote;   Poor  Judgement:  Impaired  Insight:  Lacking  Psychomotor Activity:  Normal  Concentration:  Poor  Recall:  Poor  Fund of Knowledge:Fair  Language: Good  Akathisia:  No  Handed:  Right  AIMS (if indicated):     Assets:  Housing Social Support  Sleep:      Musculoskeletal: Strength & Muscle Tone: within normal limits Gait & Station:  normal Patient leans: N/A  Treatment Plan Summary: Daily contact with patient to assess and evaluate symptoms and progress in treatment Medication management  Admit to inpatient geriatric psychiatric unit at [facility].  Waylan Boga PMHNP-BC 07/17/2014 2:21 PM  Patient seen, evaluated and I agree with notes by Nurse Practitioner. Corena Pilgrim, MD

## 2014-07-17 NOTE — ED Provider Notes (Signed)
Spoke with Sarah Acevedo patient's son in law in regards to update of client's condition.  UTI noted on clients Urine analysis obtained this am.  Patient started on Keflex 500 mg every eight hours.  Discussed possibility of UTI causing psychotic symptoms as a result of a possible delirium.  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  Head CT ordered by Emergency Department Physician Dr. Phylis Bougie. Will continue to follow.

## 2014-07-17 NOTE — BHH Counselor (Addendum)
IVC paperwork faxed to [place]. Magistrate [place] confirmed receipt. Writer faxed IVC paperwork to [place] at [place] for review. Writer called and arranged transportation with Sgt [place] reports they will call 30 mins prior to arrival.  Arnold Long, [place] Counselor   Per Gerald Stabs at [facility], pt has been accepted. He wanted to know if pt could sign herself in as voluntary. Per [place], pt not able to sign herself in. Pt is being placed under IVC. Medical laboratory scientific officer for Gerald Stabs at [facility] re: pt being changed to IVC. Gerald Stabs reports pt has been accepted by Dr. Evie Lacks. # for report is [phone]. Writer will faxed IVC for Gerald Stabs to review once pt has been served.    Arnold Long, [place] Assessment Counselor

## 2014-07-17 NOTE — BHH Counselor (Addendum)
TC to [facility] at [place]. Writer told Gerald Stabs that pt wouldn't be transferred tonight d/t needing medical treatment for her UTI. Gerald Stabs reports he can hold the bed until tomorrow for pt. TTS will need to contact [facility] tomorrow to notify them of pt's disposition. NP Kennedy Bucker spoke to pts son in law Mark Rosasco at [facility] re: pt's disposition. Writer called Therapist, music and cancelled transport to [place] for today. Sgt [place] says that pt will be transported in a car rather than a [facility] so pt will have easier time getting in vehicle.  Arnold Long, [place] Assessment Counselor

## 2014-07-18 DIAGNOSIS — F29 Unspecified psychosis not due to a substance or known physiological condition: Secondary | ICD-10-CM | POA: Diagnosis not present

## 2014-07-18 DIAGNOSIS — F0391 Unspecified dementia with behavioral disturbance: Secondary | ICD-10-CM | POA: Diagnosis not present

## 2014-07-18 MED ORDER — CEPHALEXIN 500 MG PO CAPS: 500.0000 mg | ORAL_CAPSULE | Freq: Two times a day (BID) | ORAL | Status: DC

## 2014-07-18 MED ORDER — CEPHALEXIN 500 MG PO CAPS: 500.0000 mg | ORAL_CAPSULE | Freq: Two times a day (BID) | ORAL | Status: AC

## 2014-07-18 NOTE — ED Notes (Signed)
Report called to [place] at [facility]. Accepted by Dr. Evie Lacks. Sheriffs dept will transport.

## 2014-07-18 NOTE — ED Notes (Signed)
D/C instructions and new prescription for Keflex given and explained to patient and her daughter. Will be going home with daughter as she is from [place]. Denies pain. No complaints voiced. Denies SI, HI, AVH. Gait steady with one assist. Belongings bag x1 and jacket returned. Escorted by mental health tech to front of hospital.

## 2014-07-18 NOTE — Consult Note (Signed)
[facility] Face-to-Face Psychiatry Consult   Reason for Consult:  Psychosis  Referring Physician:  EDP Sarah Acevedo is an 80 y.o. female. Total Time spent with patient: 30 minutes  Assessment: AXIS I:  Psychotic Disorder NOS AXIS II:  Deferred AXIS III:   Past Medical History  Diagnosis Date  . Cancer   . Ruptured disk   . Bone spur   . Basal cell carcinoma   . Hypothyroidism   . Depression   . Anxiety   . Hypertension    AXIS IV:  other psychosocial or environmental problems and problems related to social environment AXIS V:  51-60 moderate symptoms  Plan: Discharge home to daughter patient to follow up with neuropsychologist for further testing, and with outpatient geriatric psychiatrist.  Provided numbers for area physician.    Subjective:   Sarah Acevedo is a 80 y.o. female patient admitted with acute psychosis .  HPI:  Sarah Acevedo remains delusional and believes that her husband (who passed way 10 years ago) plotted against her and created letters that would destroy her reputation.  Patient continues to hear voices that are derogatory towards her.  Cognitive status is slightly impaired with Sarah Acevedo unable to complete 3 item recall, or to do concentration tasks.  Patient is oriented x 3 and is resting quietly with family at bedside.  Patient denies suicidal ideation, homicidal ideation, or substance abuse.  Daughter requests that patient be discharged to daughters home where she can be supervised and subsequently followed on an outpatient basis.  Family would like for involuntary admission to geriatric inpatient psychiatry to be rescinded.  Dr. Parke Poisson discussed family concerns and is in agreement with rescinding IVC.  Daughter agrees to provide around the clock supervision for patient and will take responsibility for her mother's care.  Prescription provided for Keflex and outpatient referrals discussed for neuropsychological testing and geriatric outpatient evaluation with  psychiatry.  HPI Elements:   Location:  generalized. Quality:  acute. Severity:  mild. Timing:  acute . Duration:  declining over past 6 months with acute exacerbation over past few weeks. Context:  stressors.  Past Psychiatric History: Past Medical History  Diagnosis Date  . Cancer   . Ruptured disk   . Bone spur   . Basal cell carcinoma   . Hypothyroidism   . Depression   . Anxiety   . Hypertension     reports that she has quit smoking. She does not have any smokeless tobacco history on file. She reports that she does not drink alcohol or use illicit drugs. History reviewed. No pertinent family history.         Allergies:   Allergies  Allergen Reactions  . Contrast Media [Iodinated Diagnostic Agents] Hives  . Iodine Hives    ACT Assessment Complete:  Yes:    Educational Status    Risk to Self: Risk to self with the past 6 months Is patient at risk for suicide?: No Substance abuse history and/or treatment for substance abuse?: No  Risk to Others:    Abuse:    Prior Inpatient Therapy:    Prior Outpatient Therapy:    Additional Information:                    Objective: Blood pressure 127/75, pulse 62, temperature 98.1 F (36.7 C), temperature source Oral, resp. rate 16, SpO2 97 %.There is no height or weight on file to calculate BMI. Results for orders placed or performed during the hospital encounter of  07/16/14 (from the past 72 hour(s))  CBC with Differential     Status: Abnormal   Collection Time: 07/16/14  4:13 PM  Result Value Ref Range   WBC 10.1 4.0 - 10.5 K/uL   RBC 4.61 3.87 - 5.11 MIL/uL   Hemoglobin 13.8 12.0 - 15.0 g/dL   HCT 40.2 36.0 - 46.0 %   MCV 87.2 78.0 - 100.0 fL   MCH 29.9 26.0 - 34.0 pg   MCHC 34.3 30.0 - 36.0 g/dL   RDW 12.5 11.5 - 15.5 %   Platelets 359 150 - 400 K/uL   Neutrophils Relative % 69 43 - 77 %   Neutro Abs 7.1 1.7 - 7.7 K/uL   Lymphocytes Relative 17 12 - 46 %   Lymphs Abs 1.7 0.7 - 4.0 K/uL    Monocytes Relative 11 3 - 12 %   Monocytes Absolute 1.1 (H) 0.1 - 1.0 K/uL   Eosinophils Relative 2 0 - 5 %   Eosinophils Absolute 0.2 0.0 - 0.7 K/uL   Basophils Relative 1 0 - 1 %   Basophils Absolute 0.1 0.0 - 0.1 K/uL  Comprehensive metabolic panel     Status: Abnormal   Collection Time: 07/16/14  4:13 PM  Result Value Ref Range   Sodium 133 (L) 137 - 147 mEq/L   Potassium 4.3 3.7 - 5.3 mEq/L   Chloride 98 96 - 112 mEq/L   CO2 21 19 - 32 mEq/L   Glucose, Bld 121 (H) 70 - 99 mg/dL   BUN 26 (H) 6 - 23 mg/dL   Creatinine, Ser 0.95 0.50 - 1.10 mg/dL   Calcium 9.3 8.4 - 10.5 mg/dL   Total Protein 6.9 6.0 - 8.3 g/dL   Albumin 3.6 3.5 - 5.2 g/dL   AST 12 0 - 37 U/L   ALT 9 0 - 35 U/L   Alkaline Phosphatase 68 39 - 117 U/L   Total Bilirubin 0.2 (L) 0.3 - 1.2 mg/dL   GFR calc non Af Amer 55 (L) >90 mL/min   GFR calc Af Amer 64 (L) >90 mL/min    Comment: (NOTE) The eGFR has been calculated using the CKD EPI equation. This calculation has not been validated in all clinical situations. eGFR's persistently <90 mL/min signify possible Chronic Kidney Disease.    Anion gap 14 5 - 15  Ethanol     Status: None   Collection Time: 07/16/14  4:13 PM  Result Value Ref Range   Alcohol, Ethyl (B) <11 0 - 11 mg/dL    Comment:        LOWEST DETECTABLE LIMIT FOR SERUM ALCOHOL IS 11 mg/dL FOR MEDICAL PURPOSES ONLY   Urinalysis, Routine w reflex microscopic     Status: Abnormal   Collection Time: 07/17/14  2:09 PM  Result Value Ref Range   Color, Urine YELLOW YELLOW   APPearance CLOUDY (A) CLEAR   Specific Gravity, Urine 1.017 1.005 - 1.030   pH 5.5 5.0 - 8.0   Glucose, UA NEGATIVE NEGATIVE mg/dL   Hgb urine dipstick NEGATIVE NEGATIVE   Bilirubin Urine NEGATIVE NEGATIVE   Ketones, ur NEGATIVE NEGATIVE mg/dL   Protein, ur NEGATIVE NEGATIVE mg/dL   Urobilinogen, UA 0.2 0.0 - 1.0 mg/dL   Nitrite NEGATIVE NEGATIVE   Leukocytes, UA LARGE (A) NEGATIVE  Urine microscopic-add on     Status:  Abnormal   Collection Time: 07/17/14  2:09 PM  Result Value Ref Range   Squamous Epithelial / LPF FEW (A) RARE  WBC, UA 21-50 <3 WBC/hpf   Bacteria, UA FEW (A) RARE   Labs are reviewed and are pertinent for medical issues being treated.  Current Facility-Administered Medications  Medication Dose Route Frequency Provider Last Rate Last Dose  . aspirin tablet 325 mg  325 mg Oral Daily Carmin Muskrat, MD   325 mg at 07/18/14 1023  . cephALEXin (KEFLEX) capsule 500 mg  500 mg Oral 3 times per day Waylan Boga, NP   500 mg at 07/18/14 1329  . citalopram (CELEXA) tablet 20 mg  20 mg Oral Daily Carmin Muskrat, MD   20 mg at 07/18/14 1023  . cycloSPORINE (RESTASIS) 0.05 % ophthalmic emulsion 1 drop  1 drop Both Eyes BID Carmin Muskrat, MD   1 drop at 07/18/14 1024  . diclofenac (VOLTAREN) EC tablet 75 mg  75 mg Oral BID Carmin Muskrat, MD   75 mg at 07/18/14 1023  . ferrous sulfate tablet 325 mg  325 mg Oral BID WC Carmin Muskrat, MD   325 mg at 07/18/14 0840  . levothyroxine (SYNTHROID, LEVOTHROID) tablet 50 mcg  50 mcg Oral QAC breakfast Carmin Muskrat, MD   50 mcg at 07/18/14 0840  . metoprolol tartrate (LOPRESSOR) tablet 100 mg  100 mg Oral Daily Carmin Muskrat, MD   100 mg at 07/18/14 1024  . OLANZapine zydis (ZYPREXA) disintegrating tablet 5 mg  5 mg Oral QHS Merryl Hacker, MD   5 mg at 07/17/14 2216  . pantoprazole (PROTONIX) EC tablet 40 mg  40 mg Oral Daily Carmin Muskrat, MD   40 mg at 07/18/14 1023  . risperiDONE (RISPERDAL) tablet 0.25 mg  0.25 mg Oral BID Carmin Muskrat, MD   0.25 mg at 07/18/14 1024  . simvastatin (ZOCOR) tablet 40 mg  40 mg Oral Daily Carmin Muskrat, MD   40 mg at 07/18/14 1023   Current Outpatient Prescriptions  Medication Sig Dispense Refill  . aspirin 325 MG tablet Take 325 mg by mouth daily.    Marland Kitchen atorvastatin (LIPITOR) 40 MG tablet Take 40 mg by mouth at bedtime.    . citalopram (CELEXA) 20 MG tablet Take 20 mg by mouth daily.    . cycloSPORINE  (RESTASIS) 0.05 % ophthalmic emulsion Place 1 drop into both eyes 2 (two) times daily.    . diclofenac (VOLTAREN) 75 MG EC tablet Take 75 mg by mouth 2 (two) times daily.    . ferrous sulfate 325 (65 FE) MG tablet Take 325 mg by mouth 2 (two) times daily with a meal.    . levothyroxine (SYNTHROID, LEVOTHROID) 50 MCG tablet Take 50 mcg by mouth daily.    . Melatonin 3 MG TABS Take 1 tablet by mouth at bedtime.    . metoprolol (LOPRESSOR) 100 MG tablet Take 100 mg by mouth daily.    . Multiple Vitamins-Minerals (ICAPS PO) Take 1 capsule by mouth daily.    Marland Kitchen omeprazole (PRILOSEC) 20 MG capsule Take 20 mg by mouth daily.    . risperiDONE (RISPERDAL) 0.25 MG tablet Take 0.25 mg by mouth 2 (two) times daily.    . simvastatin (ZOCOR) 40 MG tablet Take 40 mg by mouth daily.      Psychiatric Specialty Exam:     Blood pressure 127/75, pulse 62, temperature 98.1 F (36.7 C), temperature source Oral, resp. rate 16, SpO2 97 %.There is no height or weight on file to calculate BMI.  General Appearance: Casual  Eye Contact::  Fair  Speech:  Clear and Coherent  Volume:  Normal  Mood:  Irritable  Affect:  Congruent  Thought Process:  Intact and Tangential  Orientation:  Full (Time, Place, and Person)  Thought Content:  Delusions and Hallucinations: Auditory Visual  Suicidal Thoughts:  No  Homicidal Thoughts:  No  Memory:  Immediate;   Poor Recent;   Poor Remote;   Poor  Judgement:  Impaired  Insight:  Lacking  Psychomotor Activity:  Normal  Concentration:  Poor  Recall:  Poor  Fund of Knowledge:Fair  Language: Good  Akathisia:  No  Handed:  Right  AIMS (if indicated):     Assets:  Housing Social Support  Sleep:      Musculoskeletal: Strength & Muscle Tone: within normal limits Gait & Station: normal Patient leans: N/A  Treatment Plan Summary: Patient to be discharged to daughter's home.  Daughter agreement with taking responsibility for patient's care.  Outpatient follow up to be  scheduled with Dr. Casimiro Needle  Number received by  patient's family.  Neuropsychiatric evaluation recommended to assess patient's declining cognitive status. Keflex prescription provided at time of discharge.    Kennedy Bucker PMHNP-BC 07/18/2014 2:13 PM   Patient case reviewed with me as above

## 2014-07-18 NOTE — ED Notes (Signed)
Notified Robin RN at [facility] that patient would not be coming. [employer] and cancelled transportation.

## 2014-07-22 ENCOUNTER — Emergency Department (HOSPITAL_COMMUNITY)
Admission: EM | Admit: 2014-07-22 | Discharge: 2014-07-23 | Disposition: A | Discharge status: Home / Self Care | Payer: Medicare Other | Attending: Emergency Medicine | Admitting: Emergency Medicine

## 2014-07-22 ENCOUNTER — Encounter (HOSPITAL_COMMUNITY): Payer: Self-pay | Admitting: *Deleted

## 2014-07-22 ENCOUNTER — Emergency Department (HOSPITAL_COMMUNITY): Payer: Medicare Other

## 2014-07-22 DIAGNOSIS — F23 Brief psychotic disorder: Secondary | ICD-10-CM | POA: Diagnosis not present

## 2014-07-22 DIAGNOSIS — Z87891 Personal history of nicotine dependence: Secondary | ICD-10-CM | POA: Insufficient documentation

## 2014-07-22 DIAGNOSIS — F29 Unspecified psychosis not due to a substance or known physiological condition: Secondary | ICD-10-CM | POA: Diagnosis not present

## 2014-07-22 DIAGNOSIS — Z008 Encounter for other general examination: Secondary | ICD-10-CM

## 2014-07-22 DIAGNOSIS — Z8739 Personal history of other diseases of the musculoskeletal system and connective tissue: Secondary | ICD-10-CM | POA: Insufficient documentation

## 2014-07-22 DIAGNOSIS — F419 Anxiety disorder, unspecified: Secondary | ICD-10-CM | POA: Insufficient documentation

## 2014-07-22 DIAGNOSIS — Z79899 Other long term (current) drug therapy: Secondary | ICD-10-CM | POA: Diagnosis not present

## 2014-07-22 DIAGNOSIS — Z7982 Long term (current) use of aspirin: Secondary | ICD-10-CM | POA: Diagnosis not present

## 2014-07-22 DIAGNOSIS — F329 Major depressive disorder, single episode, unspecified: Secondary | ICD-10-CM | POA: Insufficient documentation

## 2014-07-22 DIAGNOSIS — Z792 Long term (current) use of antibiotics: Secondary | ICD-10-CM | POA: Insufficient documentation

## 2014-07-22 DIAGNOSIS — E039 Hypothyroidism, unspecified: Secondary | ICD-10-CM | POA: Diagnosis not present

## 2014-07-22 DIAGNOSIS — Z85828 Personal history of other malignant neoplasm of skin: Secondary | ICD-10-CM | POA: Insufficient documentation

## 2014-07-22 DIAGNOSIS — I10 Essential (primary) hypertension: Secondary | ICD-10-CM | POA: Diagnosis not present

## 2014-07-22 DIAGNOSIS — Z791 Long term (current) use of non-steroidal anti-inflammatories (NSAID): Secondary | ICD-10-CM | POA: Insufficient documentation

## 2014-07-22 DIAGNOSIS — Z046 Encounter for general psychiatric examination, requested by authority: Secondary | ICD-10-CM | POA: Diagnosis not present

## 2014-07-22 DIAGNOSIS — Z859 Personal history of malignant neoplasm, unspecified: Secondary | ICD-10-CM | POA: Insufficient documentation

## 2014-07-22 DIAGNOSIS — F03918 Unspecified dementia, unspecified severity, with other behavioral disturbance: Secondary | ICD-10-CM

## 2014-07-22 DIAGNOSIS — F0391 Unspecified dementia with behavioral disturbance: Secondary | ICD-10-CM

## 2014-07-22 DIAGNOSIS — F22 Delusional disorders: Secondary | ICD-10-CM | POA: Diagnosis present

## 2014-07-22 LAB — URINE MICROSCOPIC-ADD ON

## 2014-07-22 LAB — URINALYSIS, ROUTINE W REFLEX MICROSCOPIC
Bilirubin Urine: NEGATIVE
Glucose, UA: NEGATIVE mg/dL
Hgb urine dipstick: NEGATIVE
Ketones, ur: NEGATIVE mg/dL
Nitrite: NEGATIVE
PH: 6 (ref 5.0–8.0)
Protein, ur: NEGATIVE mg/dL
SPECIFIC GRAVITY, URINE: 1.026 (ref 1.005–1.030)
Urobilinogen, UA: 1 mg/dL (ref 0.0–1.0)

## 2014-07-22 LAB — COMPREHENSIVE METABOLIC PANEL
ALBUMIN: 3.3 g/dL — AB (ref 3.5–5.2)
ALK PHOS: 65 U/L (ref 39–117)
ALT: 8 U/L (ref 0–35)
ANION GAP: 13 (ref 5–15)
AST: 13 U/L (ref 0–37)
BUN: 34 mg/dL — AB (ref 6–23)
CO2: 24 mEq/L (ref 19–32)
CREATININE: 0.91 mg/dL (ref 0.50–1.10)
Calcium: 8.9 mg/dL (ref 8.4–10.5)
Chloride: 102 mEq/L (ref 96–112)
GFR calc Af Amer: 68 mL/min — ABNORMAL LOW (ref >90)
GFR calc non Af Amer: 58 mL/min — ABNORMAL LOW (ref >90)
Glucose, Bld: 115 mg/dL — ABNORMAL HIGH (ref 70–99)
POTASSIUM: 4 meq/L (ref 3.7–5.3)
Sodium: 139 mEq/L (ref 137–147)
TOTAL PROTEIN: 6.4 g/dL (ref 6.0–8.3)
Total Bilirubin: 0.2 mg/dL — ABNORMAL LOW (ref 0.3–1.2)

## 2014-07-22 LAB — CBC
HEMATOCRIT: 38.4 % (ref 36.0–46.0)
Hemoglobin: 12.7 g/dL (ref 12.0–15.0)
MCH: 29.5 pg (ref 26.0–34.0)
MCHC: 33.1 g/dL (ref 30.0–36.0)
MCV: 89.3 fL (ref 78.0–100.0)
Platelets: 308 10*3/uL (ref 150–400)
RBC: 4.3 MIL/uL (ref 3.87–5.11)
RDW: 12.5 % (ref 11.5–15.5)
WBC: 8.8 10*3/uL (ref 4.0–10.5)

## 2014-07-22 LAB — ACETAMINOPHEN LEVEL: Acetaminophen (Tylenol), Serum: <15 ug/mL (ref 10–30)

## 2014-07-22 LAB — RAPID URINE DRUG SCREEN, HOSP PERFORMED
Amphetamines: NOT DETECTED
Barbiturates: NOT DETECTED
Benzodiazepines: POSITIVE — AB
COCAINE: NOT DETECTED
OPIATES: NOT DETECTED
Tetrahydrocannabinol: NOT DETECTED

## 2014-07-22 LAB — ETHANOL

## 2014-07-22 LAB — SALICYLATE LEVEL: Salicylate Lvl: <2 mg/dL — ABNORMAL LOW (ref 2.8–20.0)

## 2014-07-22 MED ORDER — METOPROLOL TARTRATE 25 MG PO TABS
100.0000 mg | ORAL_TABLET | Freq: Every day | ORAL | Status: DC
  Administered 2014-07-22 – 2014-07-23 (×2): 100 mg via ORAL
  Filled 2014-07-22 (×2): qty 4

## 2014-07-22 MED ORDER — LEVOTHYROXINE SODIUM 50 MCG PO TABS
50.0000 ug | ORAL_TABLET | Freq: Every day | ORAL | Status: DC
  Administered 2014-07-22 – 2014-07-23 (×2): 50 ug via ORAL
  Filled 2014-07-22 (×3): qty 1

## 2014-07-22 MED ORDER — CITALOPRAM HYDROBROMIDE 20 MG PO TABS
20.0000 mg | ORAL_TABLET | Freq: Every day | ORAL | Status: DC
  Administered 2014-07-22 – 2014-07-23 (×2): 20 mg via ORAL
  Filled 2014-07-22 (×2): qty 1

## 2014-07-22 MED ORDER — ATORVASTATIN CALCIUM 40 MG PO TABS
40.0000 mg | ORAL_TABLET | Freq: Every day | ORAL | Status: DC
  Filled 2014-07-22 (×2): qty 1

## 2014-07-22 MED ORDER — PANTOPRAZOLE SODIUM 40 MG PO TBEC
40.0000 mg | DELAYED_RELEASE_TABLET | Freq: Every day | ORAL | Status: DC
  Administered 2014-07-22 – 2014-07-23 (×2): 40 mg via ORAL
  Filled 2014-07-22 (×2): qty 1

## 2014-07-22 MED ORDER — CYCLOSPORINE 0.05 % OP EMUL
1.0000 [drp] | Freq: Two times a day (BID) | OPHTHALMIC | Status: DC
  Administered 2014-07-22 – 2014-07-23 (×3): 1 [drp] via OPHTHALMIC
  Filled 2014-07-22 (×5): qty 1

## 2014-07-22 MED ORDER — ACETAMINOPHEN 325 MG PO TABS: 650.0000 mg | ORAL_TABLET | ORAL | Status: DC | PRN

## 2014-07-22 MED ORDER — IBUPROFEN 200 MG PO TABS: 600.0000 mg | ORAL_TABLET | Freq: Three times a day (TID) | ORAL | Status: DC | PRN

## 2014-07-22 MED ORDER — ONDANSETRON HCL 4 MG PO TABS: 4.0000 mg | ORAL_TABLET | Freq: Three times a day (TID) | ORAL | Status: DC | PRN

## 2014-07-22 MED ORDER — NICOTINE 21 MG/24HR TD PT24: 21.0000 mg | MEDICATED_PATCH | Freq: Every day | TRANSDERMAL | Status: DC | PRN

## 2014-07-22 MED ORDER — LORAZEPAM 0.5 MG PO TABS
0.5000 mg | ORAL_TABLET | Freq: Two times a day (BID) | ORAL | Status: DC | PRN
  Administered 2014-07-22: 0.5 mg via ORAL
  Filled 2014-07-22: qty 1

## 2014-07-22 MED ORDER — ASPIRIN 325 MG PO TABS
325.0000 mg | ORAL_TABLET | Freq: Every day | ORAL | Status: DC
  Administered 2014-07-22 – 2014-07-23 (×2): 325 mg via ORAL
  Filled 2014-07-22: qty 1

## 2014-07-22 MED ORDER — CEPHALEXIN 500 MG PO CAPS
500.0000 mg | ORAL_CAPSULE | Freq: Two times a day (BID) | ORAL | Status: DC
  Administered 2014-07-22 – 2014-07-23 (×3): 500 mg via ORAL
  Filled 2014-07-22 (×4): qty 1

## 2014-07-22 MED ORDER — RISPERIDONE 0.5 MG PO TABS
0.5000 mg | ORAL_TABLET | Freq: Every day | ORAL | Status: DC
  Administered 2014-07-22: 0.5 mg via ORAL
  Filled 2014-07-22: qty 1

## 2014-07-22 MED ORDER — MELATONIN 3 MG PO TABS: 1.0000 | ORAL_TABLET | Freq: Every day | ORAL | Status: DC

## 2014-07-22 MED ORDER — ZOLPIDEM TARTRATE 5 MG PO TABS: 5.0000 mg | ORAL_TABLET | Freq: Every evening | ORAL | Status: DC | PRN

## 2014-07-22 NOTE — ED Notes (Addendum)
Glasses in room with patient.  Polygrip in medication drawer.
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
## 2014-07-22 NOTE — ED Notes (Signed)
Daughter Jenny Reichmann wants to be called if pt transferred tonight. She does not care what time of night, please call [phone]. Consent to release in chart

## 2014-07-22 NOTE — ED Notes (Addendum)
Pt has in belonging bag:  Blue jeans, blue and sky blue light zip up jacket, white sweater, white socks, white shoes, poligrip denture care tube, brown bra

## 2014-07-22 NOTE — ED Notes (Signed)
Patient calm, cooperative. Denies SI, HI, AVH. Denies feelings of anxiety or depression. Reports no recent changes in sleep habits or appetite. Patient states that recent medications have made her feel dizzy at times.  Encouragement offered. Beverage given. Reminded of fall safety precautions.   Q 15 safety checks in place.
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
## 2014-07-22 NOTE — Progress Notes (Signed)
PHARMACIST - PHYSICIAN ORDER COMMUNICATION  CONCERNING: P&T Medication Policy on Herbal Medications  DESCRIPTION:  This patient's order for:  Melatonin  has been noted.  This product(s) is classified as an "herbal" or natural product. Due to a lack of definitive safety studies or FDA approval, nonstandard manufacturing practices, plus the potential risk of unknown drug-drug interactions while on inpatient medications, the Pharmacy and Therapeutics Committee does not permit the use of "herbal" or natural products of this type within [facility].   ACTION TAKEN: The pharmacy department is unable to verify this order at this time and your patient has been informed of this safety policy. Please reevaluate patient's clinical condition at discharge and address if the herbal or natural product(s) should be resumed at that time.   Dorrene German 07/22/2014 4:15 AM
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
## 2014-07-22 NOTE — Progress Notes (Signed)
Faxed referral information to [place], [facility], [facility] Deutscher, [place] and [place] for potential placement.   Bedelia Person, M.S., LPCA, [facility] Licensed Professional Counselor Associate  Triage Specialist  [facility]  Therapeutic Triage Services Phone: [phone] Fax: [phone]

## 2014-07-23 DIAGNOSIS — Z046 Encounter for general psychiatric examination, requested by authority: Secondary | ICD-10-CM | POA: Diagnosis not present

## 2014-07-23 LAB — URINE CULTURE: Colony Count: 10000

## 2014-07-23 MED ORDER — ATORVASTATIN CALCIUM 40 MG PO TABS: 40.0000 mg | ORAL_TABLET | Freq: Every day | ORAL | Status: DC

## 2014-07-23 MED ORDER — RISPERIDONE 0.5 MG PO TABS: 0.5000 mg | ORAL_TABLET | Freq: Every day | ORAL | Status: DC

## 2014-07-23 NOTE — ED Notes (Signed)
Attempted to call report [place].  Left number to return call.

## 2014-07-23 NOTE — BH Assessment (Signed)
Per Anderson Malta at [place] pt has been accepted, but no bed has been assigned until discharges later today. Pt will be under the care of [facility].   Lear Ng, [facility] Triage Specialist 07/23/2014 6:21 AM

## 2014-07-23 NOTE — ED Notes (Signed)
Report called to Zella Richer at [place].

## 2014-07-23 NOTE — BHH Counselor (Signed)
Per Anderson Malta, pt will need to IVC'd prior to transport due to current mental status.  Pt accepted by Tommye Standard, NP, pls call report [phone]. Pls fax legals before transport [phone].

## 2014-07-23 NOTE — Progress Notes (Signed)
Shirlee Limerick from [place] called and stated pt bed is ready. Pt transportation under IVC can be arranged. CSW informed Leta Jungling [phone]  ED CSW 07/23/2014 1413pm

## 2014-07-23 NOTE — ED Notes (Signed)
Sheriff's office contacted for transport to [facility]

## 2014-07-23 NOTE — Discharge Instructions (Signed)
Dementia °Dementia is a word that is used to describe problems with the brain and how it works. People with dementia have memory loss. They may also have problems with thinking, speaking, or solving problems. It can affect how they act around people, how they do their job, their mood, and their personality. These changes may not show up for a long time. Family or friends may not notice problems in the early part of this disease. °HOME CARE °The following tips are for the person living with, or caring for, the person with dementia. °Make the home safe. °· Remove locks on bathroom doors. °· Use childproof locks on cabinets where alcohol, cleaning supplies, or chemicals are stored. °· Put outlet covers in electrical outlets. °· Put in childproof locks to keep doors and windows safe. °· Remove stove knobs, or put in safety knobs that shut off on their own. °· Lower the temperature on water heaters. °· Label medicines. Lock them in a safe place. °· Keep knives, lighters, matches, power tools, and guns out of reach or in a safe place. °· Remove objects that might break or can hurt the person. °· Make sure lighting is good inside and outside. °· Put in grab bars if needed. °· Use a device that detects falls or other needs for help. °Lessen confusion. °· Keep familiar objects and people around. °· Use night lights or low lit (dim) lights at night. °· Label objects or areas. °· Use reminders, notes, or directions for daily activities or tasks. °· Keep a simple routine that is the same for waking, meals, bathing, dressing, and bedtime. °· Create a calm and quiet home. °· Put up clocks and calendars. °· Keep emergency numbers and the home address near all phones. °· Help show the different times of day. Open the curtains during the day to let light in. °Speak clearly and directly. °· Choose simple words and short sentences. °· Use a gentle, calm voice. °· Do not interrupt. °· If the person has a hard time finding a word to  use, give them the word or thought. °· Ask 1 question at a time. Give enough time for the person to answer. Repeat the question if the person does not answer. °Do things that lessen restlessness. °· Provide a comfortable bed. °· Have the same bedtime routine every night. °· Have a regular walking and activity schedule. °· Lessen naps during the day. °· Do not let the person drink a lot of caffeine. °· Go to events that are not overwhelming. °Eat well and drink fluids. °· Lessen distractions during meal times and snacks. °· Avoid foods that are too hot or too cold. °· Watch how the person chews and swallows. This is to make sure they do not choke. °Other °· Keep all vision, hearing, dental, and medical visits with the doctor. °· Only give medicines as told by the doctor. °· Watch the person's driving ability. Do not let the person drive if he or she cannot drive safely. °· Use a program that helps find a person if they become missing. You may need to register with this program. °GET HELP RIGHT AWAY IF:  °· A fever of 102° F (38.9° C) develops. °· Confusion develops or gets worse. °· Sleepiness develops or gets worse. °· Staying awake is hard to do. °· New behavior problems start like mood swings, aggression, and seeing things that are not there. °· Problems with balance, speech, or falling develop. °· Problems swallowing develop. °· Any   problems of another sickness develop. MAKE SURE YOU:  Understand these instructions.  Will watch his or her condition.  Will get help right away if he or she is not doing well or gets worse. Document Released: 08/03/2008 Document Revised: 11/13/2011 Document Reviewed: 01/16/2011 [facility] Patient Information 2015 [place], [place]. This information is not intended to replace advice given to you by your health care provider. Make sure you discuss any questions you have with your health care provider.  Paranoia Paranoia is a distrust of others that is not based on a real reason  for distrust. This may reach delusional levels. This means the paranoid person feels the world is against them when there is no reason to make them feel that way. People with paranoia feel as though people around them are "out to get them".  SIMILAR MENTAL ILNESSES  Depression is a feeling as though you are down all the time. It is normal in some situations where you have just lost a loved one. It is abnormal if you are having feelings of paranoia with it.  Dementia is a physical problem with the brain in which the brain no longer works properly. There are problems with daily activities of living. Alzheimer's disease is one example of this. Dementia is also caused by old age changes in the brain which come with the death of brain cells and small strokes.  Paranoidschizophrenia. People with paranoid schizophrenia and persecutory delusional disorder have delusions in which they feel people around them are plotting against them. Persecutory delusions in paranoid schizophrenia are bizarre, sometimes grandiose, and often accompanied by auditory hallucinations. This means the person is hearing voices that are not there.  Delusionaldisorder (persecutory type). Delusions experienced by individuals with delusional disorder are more believable than those experienced by paranoid schizophrenics; they are not bizarre, though still unjustified. Individuals with delusional disorder may seem offbeat or quirky rather than mentally ill, and therefore, may never seek treatment. All of these problems usually do not allow these people to interact socially in an acceptable manner. CAUSES The cause of paranoia is often not known. It is common in people with extended abuse of:  Cocaine.  Amphetamine.  Marijuana.  Alcohol. Sometimes there is an inherited tendency. It may be associated with stress or changes in brain chemistry. DIAGNOSIS  When paranoia is present, your caregiver may:  Refer you to a specialist.  Do  a physical exam.  Perform other tests on you to make sure there are not other problems causing the paranoia including:  Physical problems.  Mental problems.  Chemical problems (other than drugs). Testing may be done to determine if there is a psychiatric disability present that can be treated with medicine. TREATMENT   Paranoia that is a symptom of a psychiatric problem should be treated by professionals.  Medicines are available which can help this disorder. Antipsychotic medicine may be prescribed by your caregiver.  Sometimes psychotherapy may be useful.  Conditions such as depression or drug abuse are treated individually. If the paranoia is caused by drug abuse, a treatment facility may be helpful. Depression may be helped by antidepressants. PROGNOSIS   Paranoid people are difficult to treat because of their belief that everyone is out to get them or harm them. Because of this mistrust, they often must be talked into entering treatment by a trusted family member or friend. They may not want to take medicine as they may see this as an attempt to poison them.  Gradual gains in the trust of  a therapist or caregiver helps in a successful treatment plan.  Some people with PPD or persecutory delusional disorder function in society without treatment in limited fashion. Document Released: 08/24/2003 Document Revised: 11/13/2011 Document Reviewed: 04/28/2008 [facility] Patient Information 2015 [place], [place]. This information is not intended to replace advice given to you by your health care provider. Make sure you discuss any questions you have with your health care provider.

## 2015-12-07 IMAGING — DX DG CHEST 1V
1 series · 1 of 1 positions shown · non-contrast
Comparison: None.

CLINICAL DATA: Acute psychosis

EXAM:
CHEST - 1 VIEW

[chest ap]
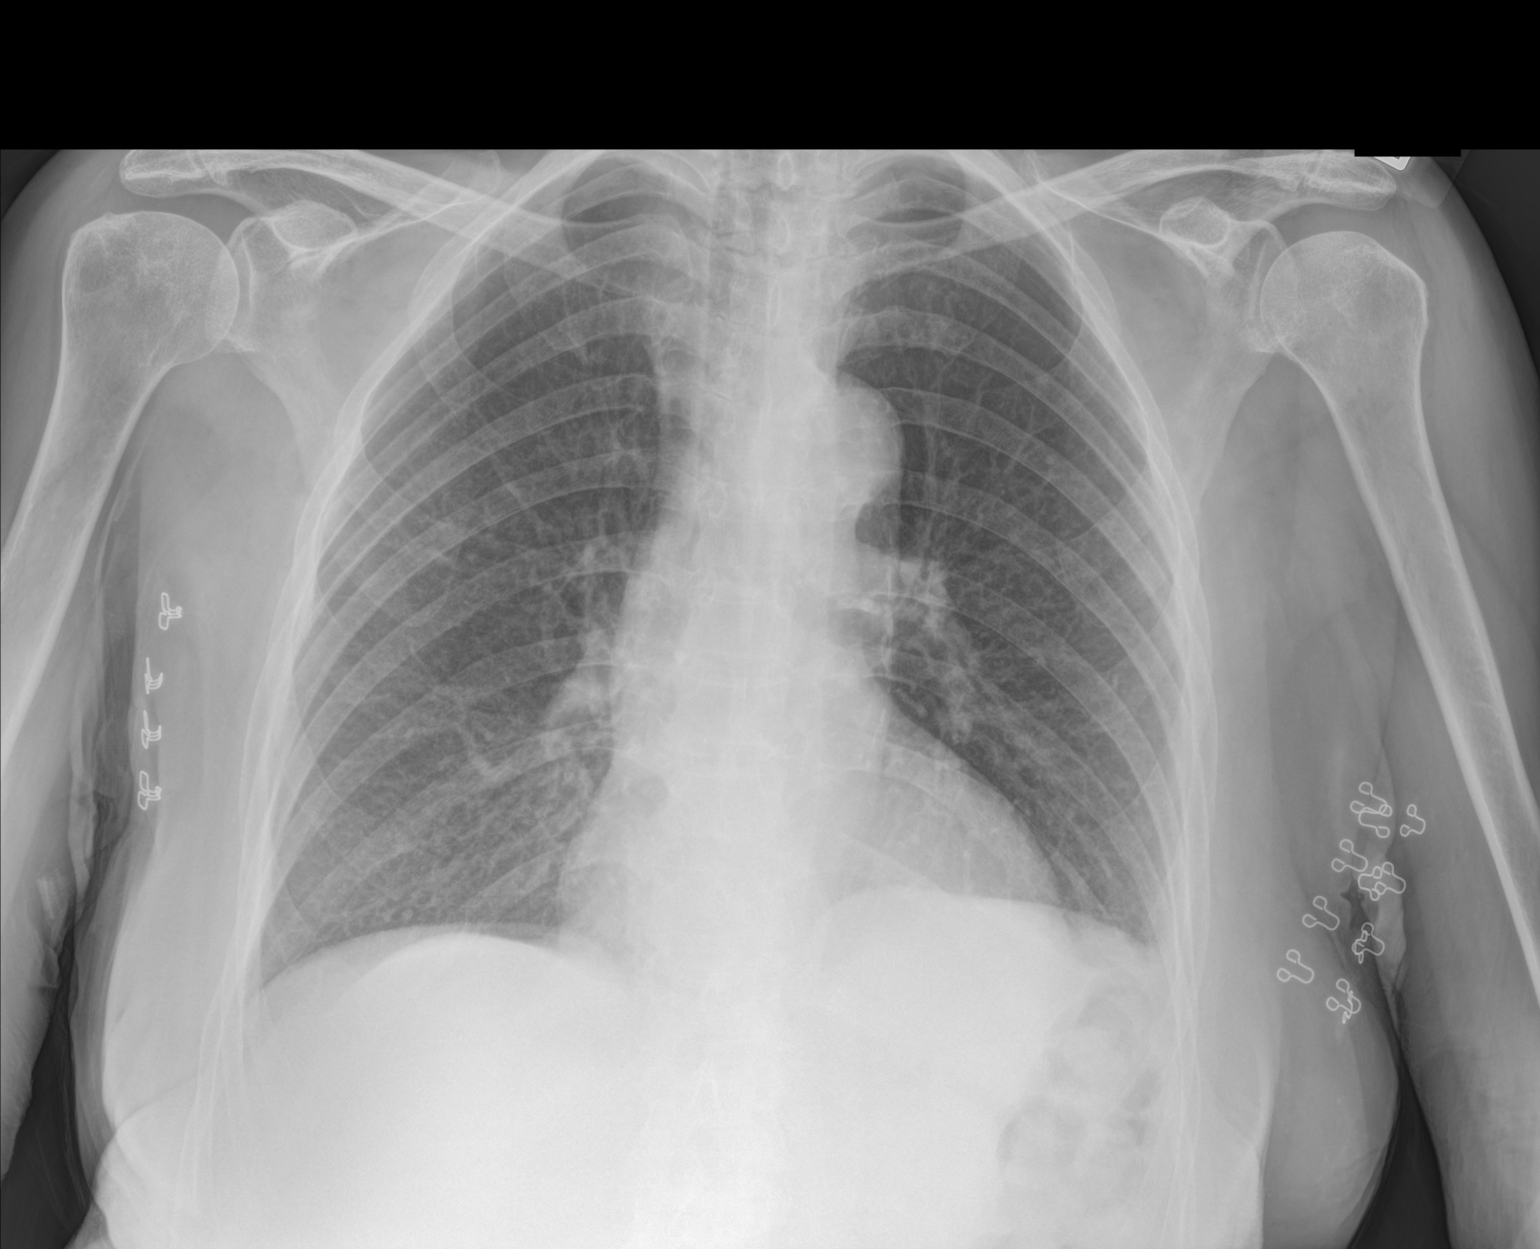

[1 of 1 positions shown; findings below may reference images not displayed]

FINDINGS: There is no edema or consolidation. Heart size and pulmonary
vascularity are normal. No adenopathy. There is atherosclerotic
change in aorta. No bone lesions.
IMPRESSION: No edema or consolidation.

## 2015-12-07 IMAGING — CT CT HEAD W/O CM
2 series · 17 of 30 positions shown, 20 images · non-contrast
Comparison: None.

CLINICAL DATA: Altered mental status, hallucinations, behavior
changing

EXAM:
CT HEAD WITHOUT CONTRAST
TECHNIQUE: Contiguous axial images were obtained from the base of the skull
through the vertex without intravenous contrast.

[Series 2: head w/o · axial · non-contrast · 0.45mm/px · z∈[-136,-16]mm · 9 of 30 slices shown, 12 images]
[im 3/30  brain]
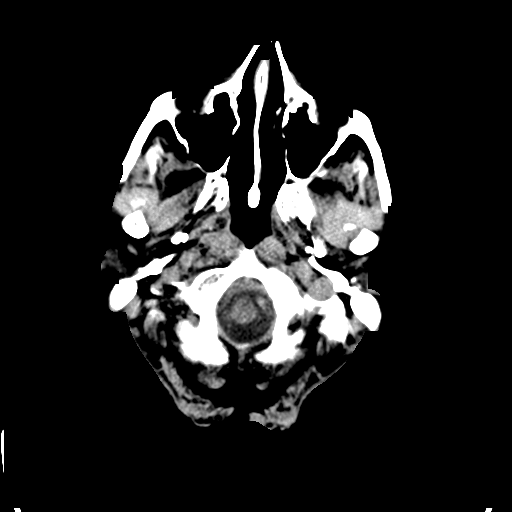
[im 3/30  bone]
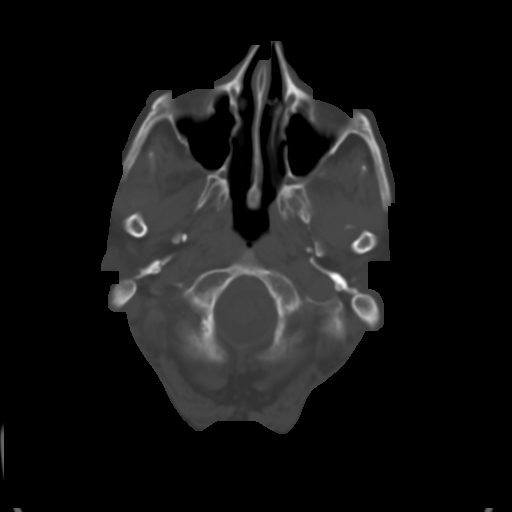
[im 6/30  brain]
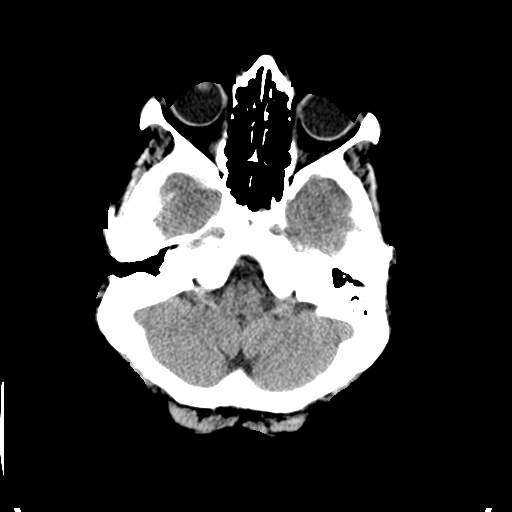
[im 9/30  brain]
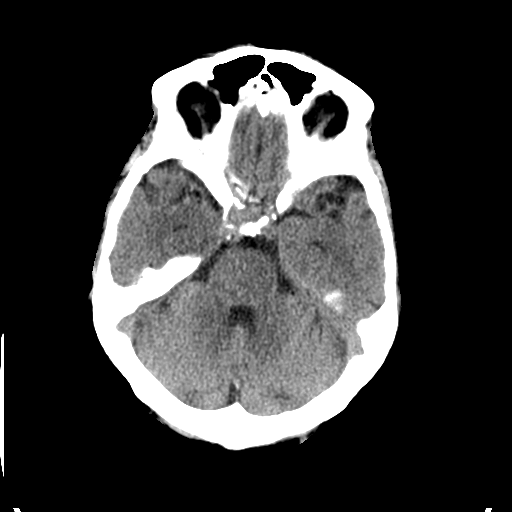
[im 12/30  brain]
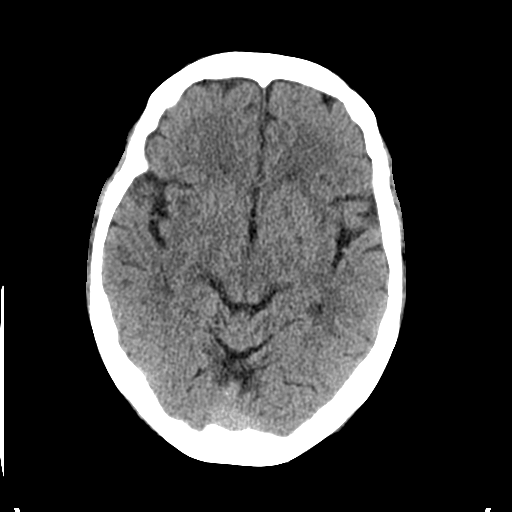
[im 15/30  brain]
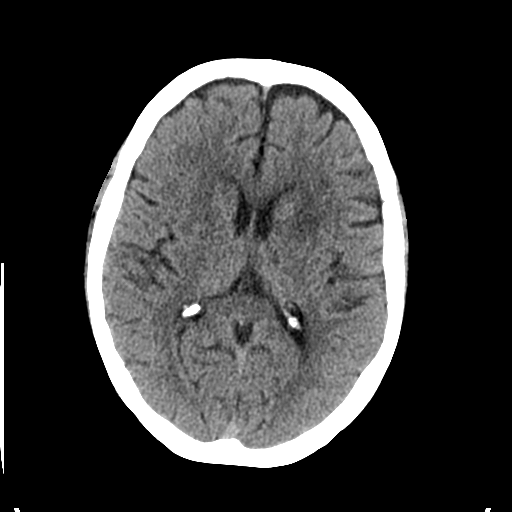
[im 15/30  bone]
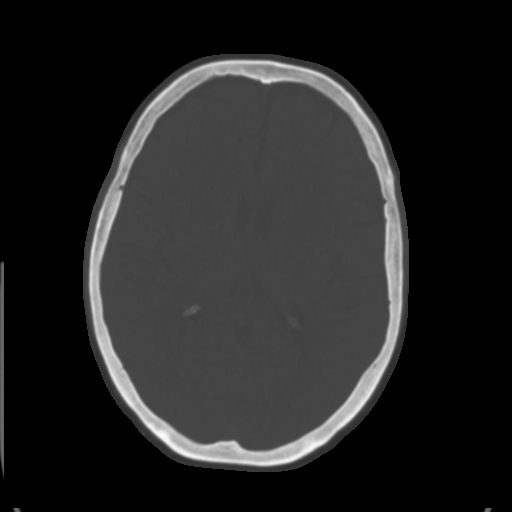
[im 18/30  brain]
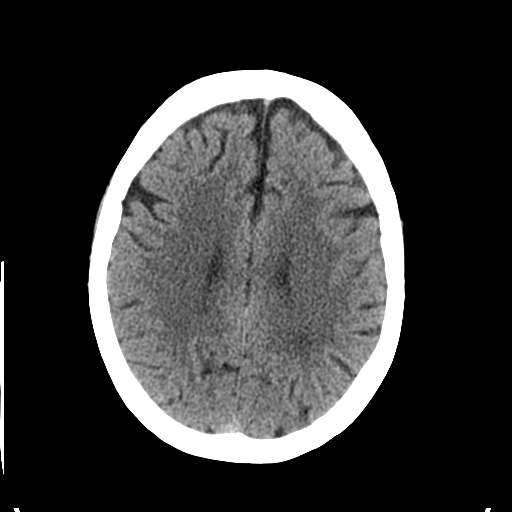
[im 21/30  brain]
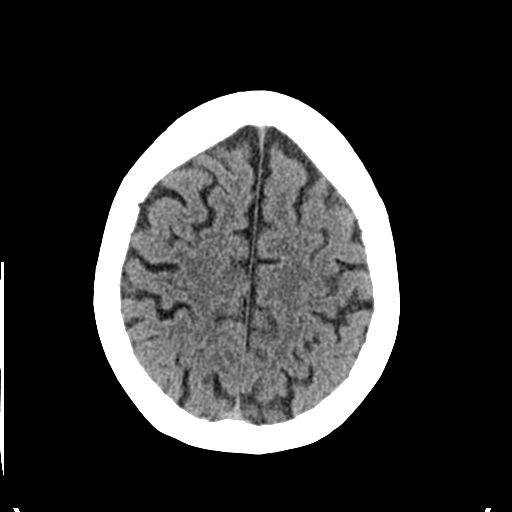
[im 24/30  brain]
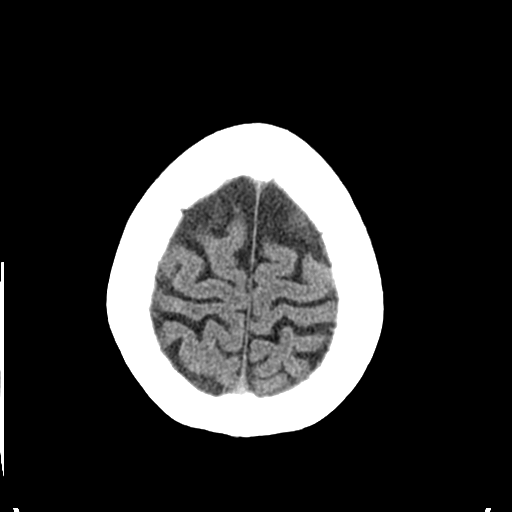
[im 27/30  brain]
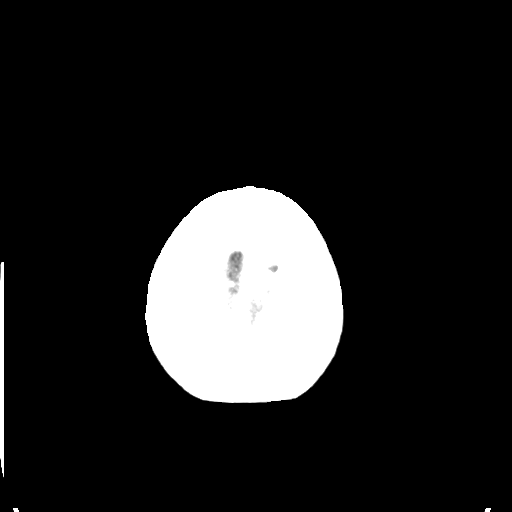
[im 27/30  bone]
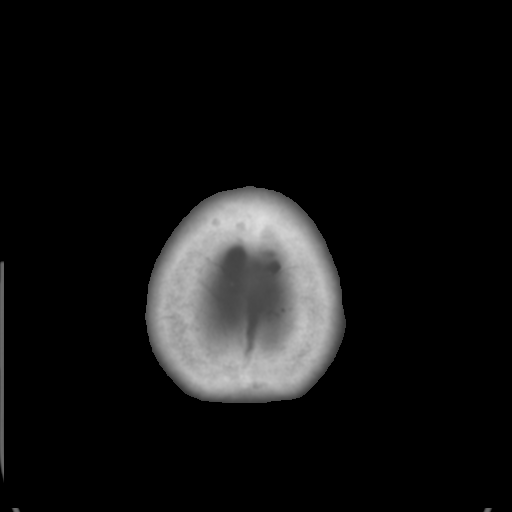

[Series 3: bone windows · axial · 0.45mm/px · z∈[-131,-17]mm · 8 of 50 slices shown]
[im 6/50  bone]
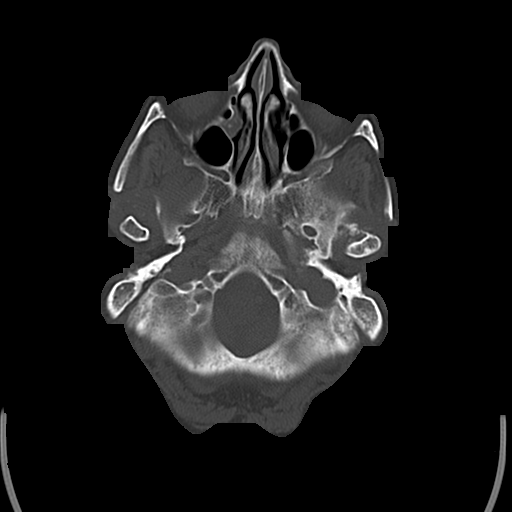
[im 11/50  bone]
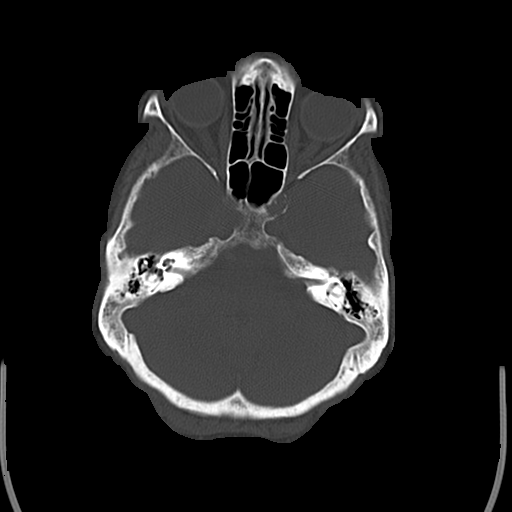
[im 17/50  bone]
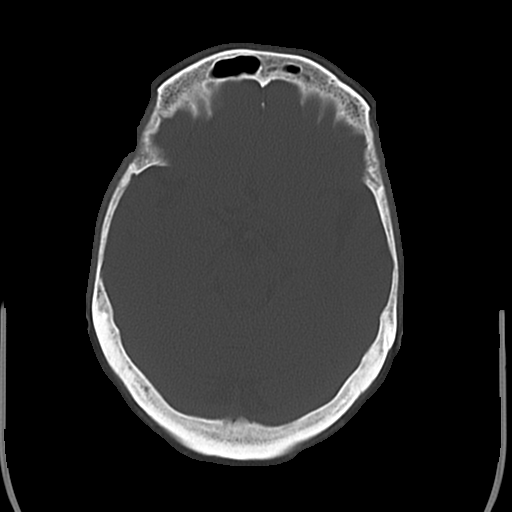
[im 22/50  bone]
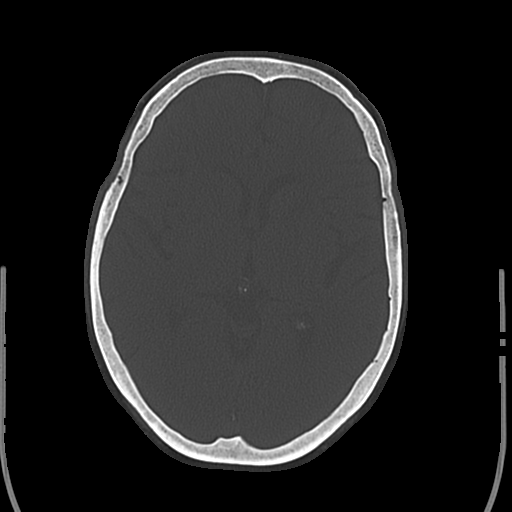
[im 28/50  bone]
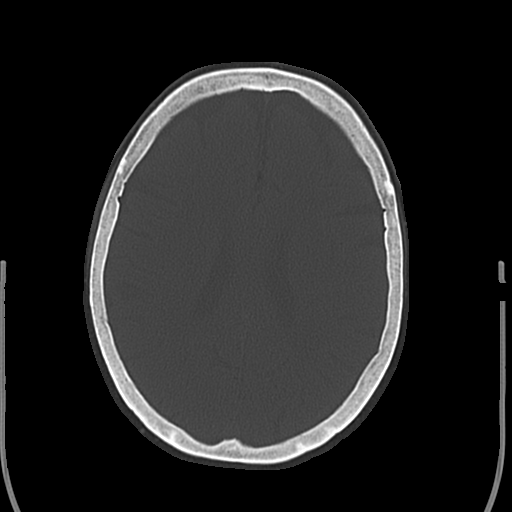
[im 33/50  bone]
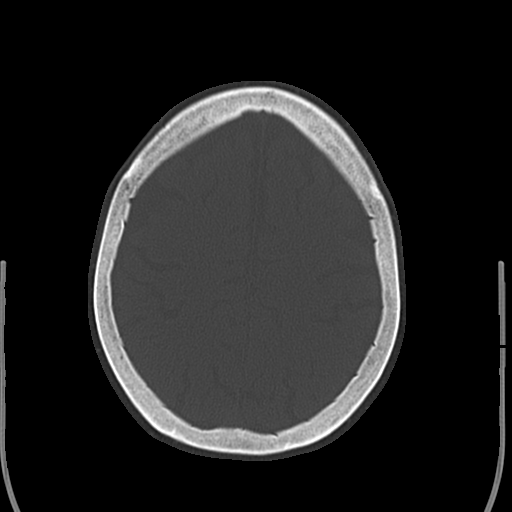
[im 39/50  bone]
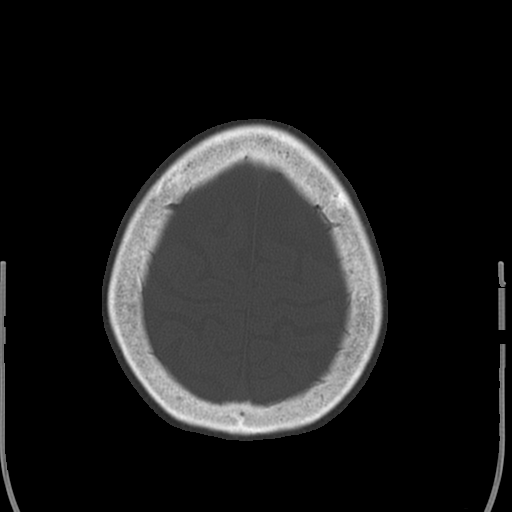
[im 44/50  bone]
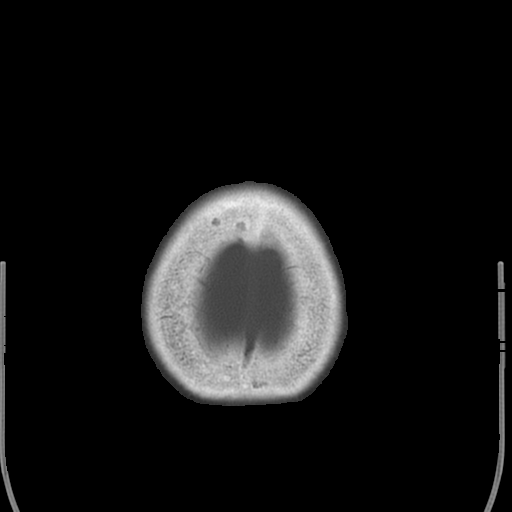

[17 of 30 positions shown; findings below may reference images not displayed]

FINDINGS: No skull fracture is noted. Atherosclerotic calcifications of
carotid siphon are noted. Mild cerebral atrophy. No intracranial
hemorrhage, mass effect or midline shift.

No mass lesion is noted on this unenhanced scan. Periventricular and
patchy subcortical white matter decreased attenuation probable due
to chronic small vessel ischemic changes. No definite acute cortical
infarction.
IMPRESSION: No acute intracranial abnormality. No definite acute cortical
infarction. Mild cerebral atrophy. Periventricular and patchy
subcortical white matter decreased attenuation probable due to
chronic small vessel ischemic changes.

## 2015-12-12 IMAGING — CR DG CHEST 2V
2 series · 2 of 2 positions shown · non-contrast
Comparison: Prior radiograph from 07/17/2014

CLINICAL DATA: Initial evaluation for medical clearance for
psychiatric admission.

EXAM:
CHEST  2 VIEW

[w chest pa]
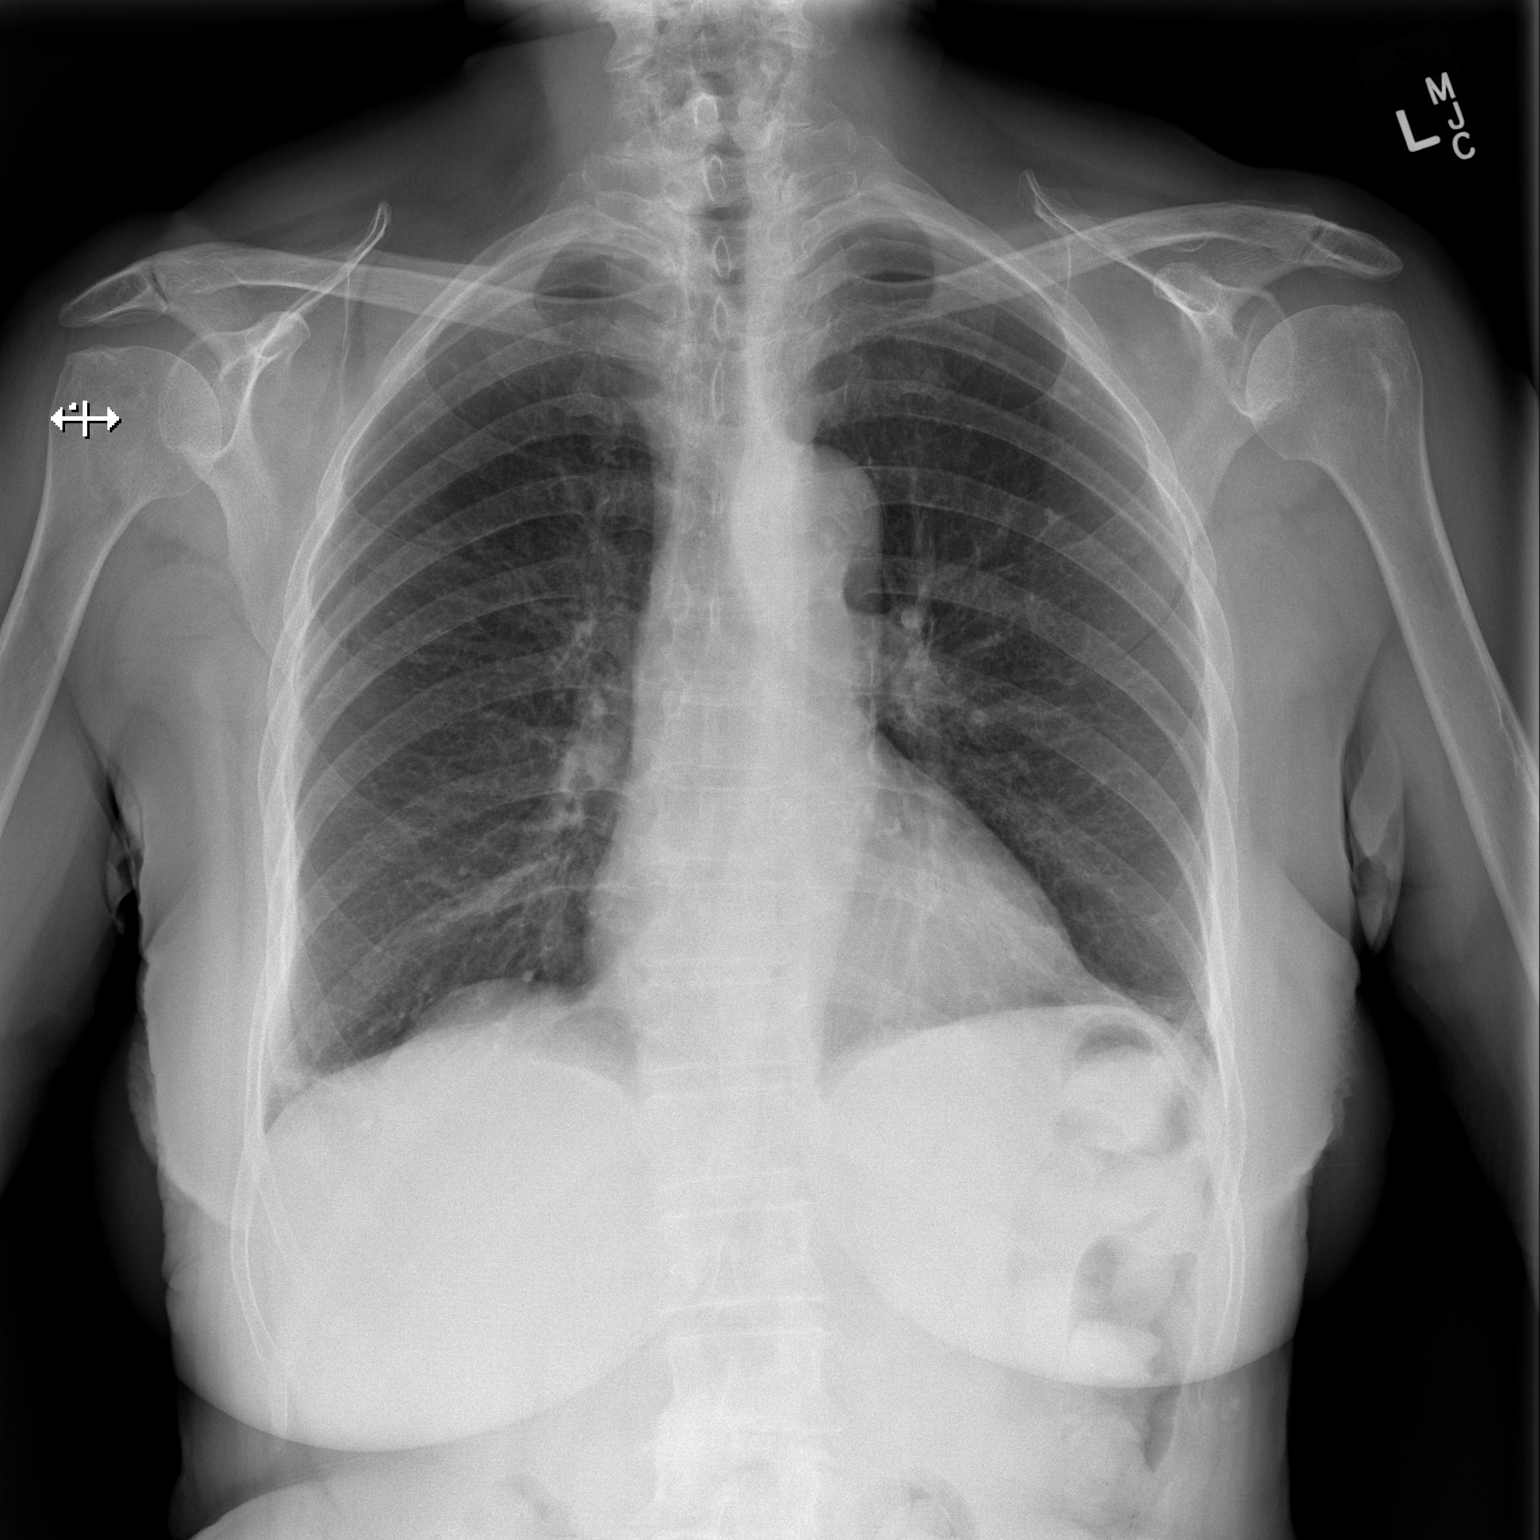

[w chest lat]
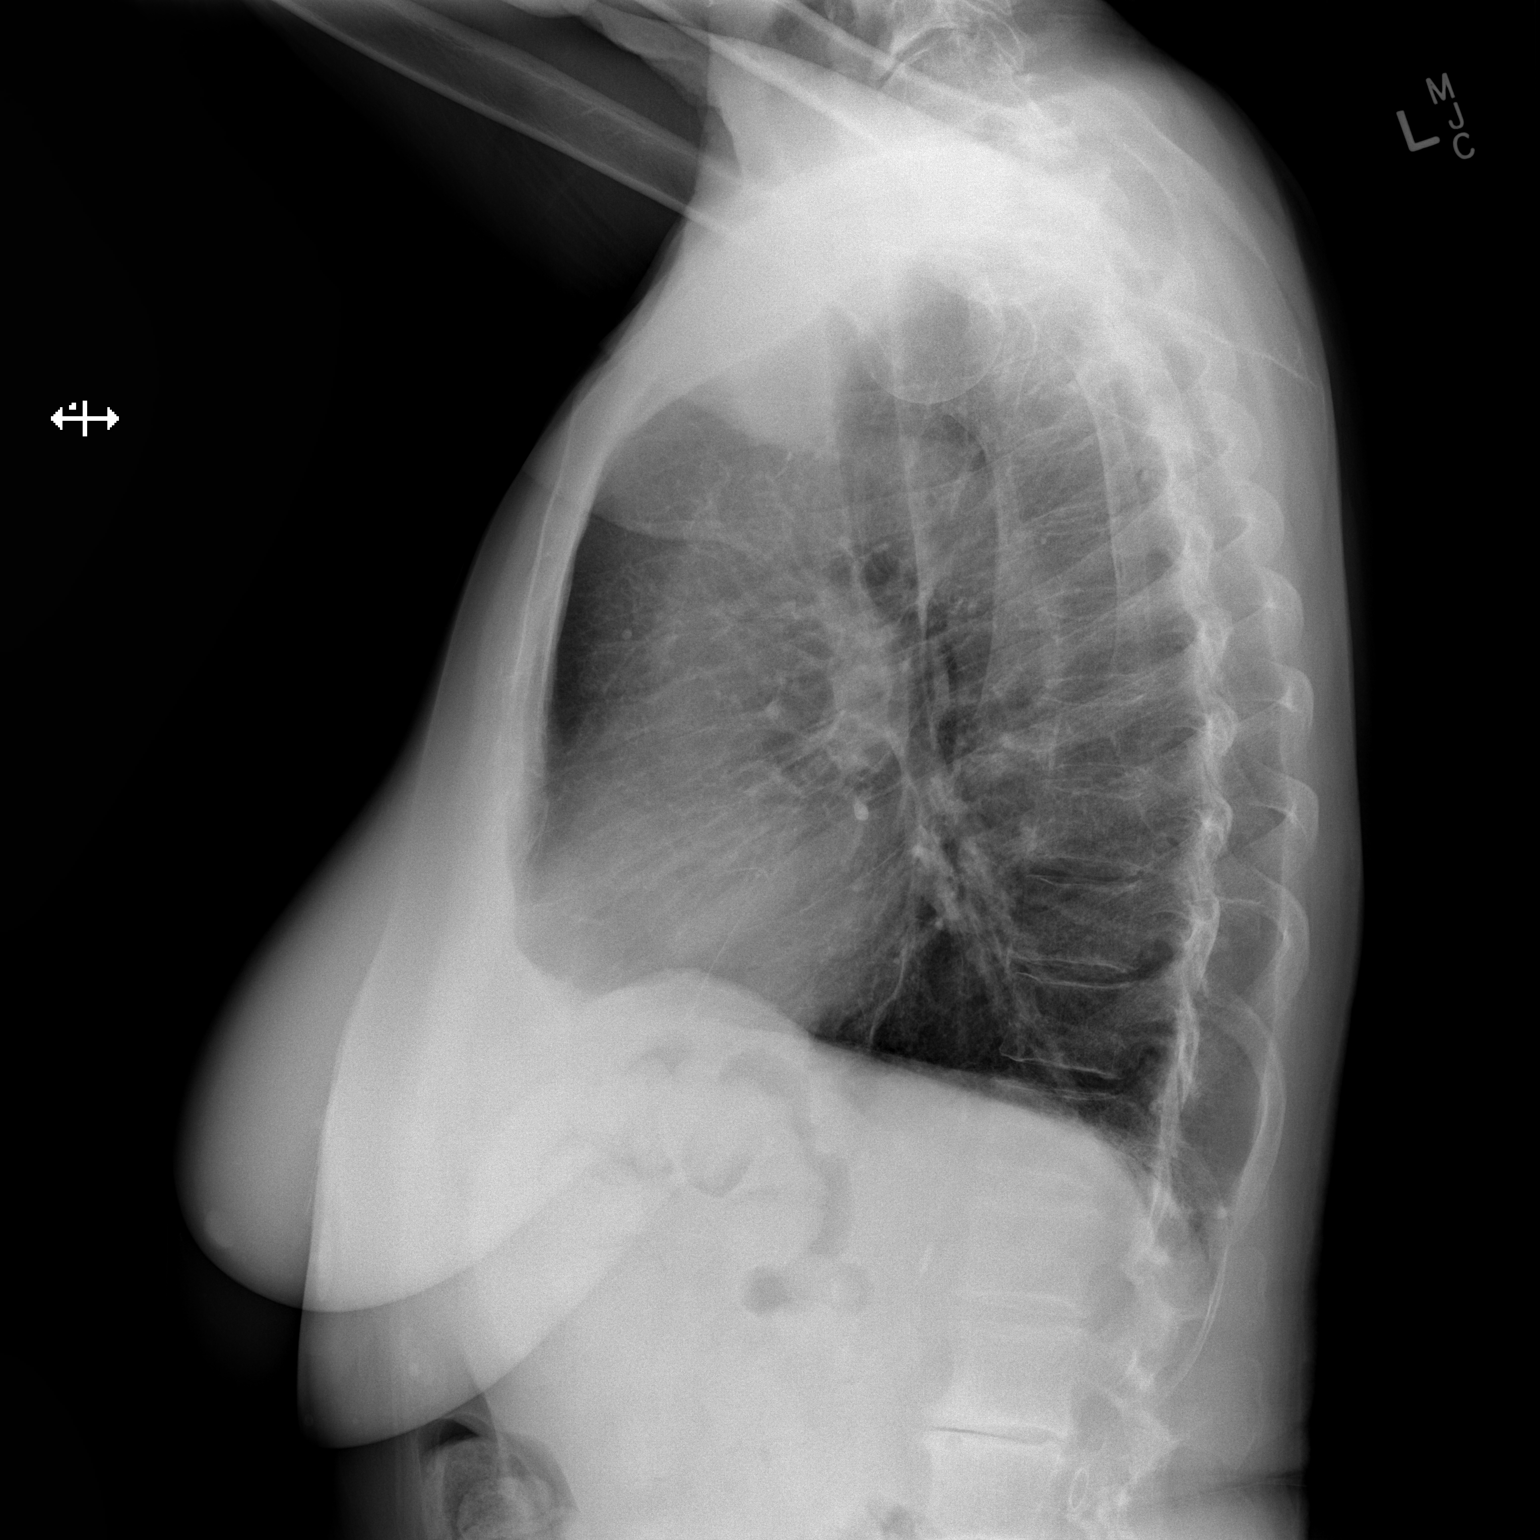

[2 of 2 positions shown; findings below may reference images not displayed]

FINDINGS: The cardiac and mediastinal silhouettes are stable in size and
contour, and remain within normal limits.

The lungs are normally inflated. Minimal bibasilar atelectasis
present. No airspace consolidation, pleural effusion, or pulmonary
edema is identified. There is no pneumothorax.

No acute osseous abnormality identified.
IMPRESSION: Mild bibasilar atelectasis. No other acute cardiopulmonary
abnormality.

## 2019-03-22 ENCOUNTER — Emergency Department (HOSPITAL_COMMUNITY): Payer: Medicare Other

## 2019-03-22 ENCOUNTER — Encounter (HOSPITAL_COMMUNITY): Payer: Self-pay | Admitting: Emergency Medicine

## 2019-03-22 ENCOUNTER — Other Ambulatory Visit: Payer: Self-pay

## 2019-03-22 ENCOUNTER — Inpatient Hospital Stay (HOSPITAL_COMMUNITY)
Admission: EM | Admit: 2019-03-22 | Discharge: 2019-03-26 | DRG: 690 | Disposition: A | Discharge status: Skilled Nursing Facility | Payer: Medicare Other | Attending: Internal Medicine | Admitting: Internal Medicine

## 2019-03-22 DIAGNOSIS — I10 Essential (primary) hypertension: Secondary | ICD-10-CM | POA: Diagnosis not present

## 2019-03-22 DIAGNOSIS — F419 Anxiety disorder, unspecified: Secondary | ICD-10-CM | POA: Diagnosis present

## 2019-03-22 DIAGNOSIS — N179 Acute kidney failure, unspecified: Secondary | ICD-10-CM | POA: Diagnosis not present

## 2019-03-22 DIAGNOSIS — H5711 Ocular pain, right eye: Secondary | ICD-10-CM

## 2019-03-22 DIAGNOSIS — Z789 Other specified health status: Secondary | ICD-10-CM

## 2019-03-22 DIAGNOSIS — B961 Klebsiella pneumoniae [K. pneumoniae] as the cause of diseases classified elsewhere: Secondary | ICD-10-CM | POA: Diagnosis present

## 2019-03-22 DIAGNOSIS — E039 Hypothyroidism, unspecified: Secondary | ICD-10-CM | POA: Diagnosis present

## 2019-03-22 DIAGNOSIS — M779 Enthesopathy, unspecified: Secondary | ICD-10-CM | POA: Diagnosis present

## 2019-03-22 DIAGNOSIS — Z66 Do not resuscitate: Secondary | ICD-10-CM | POA: Diagnosis present

## 2019-03-22 DIAGNOSIS — E785 Hyperlipidemia, unspecified: Secondary | ICD-10-CM | POA: Diagnosis present

## 2019-03-22 DIAGNOSIS — R410 Disorientation, unspecified: Secondary | ICD-10-CM

## 2019-03-22 DIAGNOSIS — F329 Major depressive disorder, single episode, unspecified: Secondary | ICD-10-CM | POA: Diagnosis present

## 2019-03-22 DIAGNOSIS — R627 Adult failure to thrive: Secondary | ICD-10-CM | POA: Diagnosis present

## 2019-03-22 DIAGNOSIS — N390 Urinary tract infection, site not specified: Secondary | ICD-10-CM | POA: Diagnosis present

## 2019-03-22 DIAGNOSIS — F32A Depression, unspecified: Secondary | ICD-10-CM | POA: Diagnosis present

## 2019-03-22 DIAGNOSIS — I2510 Atherosclerotic heart disease of native coronary artery without angina pectoris: Secondary | ICD-10-CM | POA: Diagnosis present

## 2019-03-22 DIAGNOSIS — N3000 Acute cystitis without hematuria: Secondary | ICD-10-CM | POA: Diagnosis not present

## 2019-03-22 DIAGNOSIS — E789 Disorder of lipoprotein metabolism, unspecified: Secondary | ICD-10-CM | POA: Diagnosis present

## 2019-03-22 DIAGNOSIS — Z681 Body mass index (BMI) 19 or less, adult: Secondary | ICD-10-CM

## 2019-03-22 DIAGNOSIS — R413 Other amnesia: Secondary | ICD-10-CM

## 2019-03-22 DIAGNOSIS — Z79899 Other long term (current) drug therapy: Secondary | ICD-10-CM

## 2019-03-22 DIAGNOSIS — E860 Dehydration: Secondary | ICD-10-CM | POA: Diagnosis present

## 2019-03-22 DIAGNOSIS — Z85828 Personal history of other malignant neoplasm of skin: Secondary | ICD-10-CM

## 2019-03-22 DIAGNOSIS — Z91041 Radiographic dye allergy status: Secondary | ICD-10-CM

## 2019-03-22 DIAGNOSIS — I720 Aneurysm of carotid artery: Secondary | ICD-10-CM

## 2019-03-22 DIAGNOSIS — Z91048 Other nonmedicinal substance allergy status: Secondary | ICD-10-CM

## 2019-03-22 DIAGNOSIS — Z1159 Encounter for screening for other viral diseases: Secondary | ICD-10-CM

## 2019-03-22 DIAGNOSIS — F320 Major depressive disorder, single episode, mild: Secondary | ICD-10-CM | POA: Diagnosis not present

## 2019-03-22 DIAGNOSIS — Z7982 Long term (current) use of aspirin: Secondary | ICD-10-CM

## 2019-03-22 DIAGNOSIS — J449 Chronic obstructive pulmonary disease, unspecified: Secondary | ICD-10-CM | POA: Diagnosis present

## 2019-03-22 DIAGNOSIS — Z79890 Hormone replacement therapy (postmenopausal): Secondary | ICD-10-CM

## 2019-03-22 DIAGNOSIS — Z87891 Personal history of nicotine dependence: Secondary | ICD-10-CM

## 2019-03-22 LAB — URINALYSIS, ROUTINE W REFLEX MICROSCOPIC
Bilirubin Urine: NEGATIVE
Glucose, UA: NEGATIVE mg/dL
Hgb urine dipstick: NEGATIVE
Ketones, ur: NEGATIVE mg/dL
Nitrite: NEGATIVE
Protein, ur: NEGATIVE mg/dL
Specific Gravity, Urine: 1.011 (ref 1.005–1.030)
pH: 5 (ref 5.0–8.0)

## 2019-03-22 LAB — CBC WITH DIFFERENTIAL/PLATELET
Abs Immature Granulocytes: 0.03 10*3/uL (ref 0.00–0.07)
Basophils Absolute: 0.1 10*3/uL (ref 0.0–0.1)
Basophils Relative: 1 %
Eosinophils Absolute: 0.2 10*3/uL (ref 0.0–0.5)
Eosinophils Relative: 2 %
HCT: 40.9 % (ref 36.0–46.0)
Hemoglobin: 13.7 g/dL (ref 12.0–15.0)
Immature Granulocytes: 0 %
Lymphocytes Relative: 22 %
Lymphs Abs: 1.7 10*3/uL (ref 0.7–4.0)
MCH: 29.1 pg (ref 26.0–34.0)
MCHC: 33.5 g/dL (ref 30.0–36.0)
MCV: 87 fL (ref 80.0–100.0)
Monocytes Absolute: 0.9 10*3/uL (ref 0.1–1.0)
Monocytes Relative: 12 %
Neutro Abs: 4.9 10*3/uL (ref 1.7–7.7)
Neutrophils Relative %: 63 %
Platelets: 311 10*3/uL (ref 150–400)
RBC: 4.7 MIL/uL (ref 3.87–5.11)
RDW: 13 % (ref 11.5–15.5)
WBC: 7.8 10*3/uL (ref 4.0–10.5)
nRBC: 0 % (ref 0.0–0.2)

## 2019-03-22 LAB — BASIC METABOLIC PANEL
Anion gap: 11 (ref 5–15)
BUN: 24 mg/dL — ABNORMAL HIGH (ref 8–23)
CO2: 22 mmol/L (ref 22–32)
Calcium: 9.1 mg/dL (ref 8.9–10.3)
Chloride: 98 mmol/L (ref 98–111)
Creatinine, Ser: 1.02 mg/dL — ABNORMAL HIGH (ref 0.44–1.00)
GFR calc Af Amer: 59 mL/min — ABNORMAL LOW (ref >60)
GFR calc non Af Amer: 51 mL/min — ABNORMAL LOW (ref >60)
Glucose, Bld: 115 mg/dL — ABNORMAL HIGH (ref 70–99)
Potassium: 3.9 mmol/L (ref 3.5–5.1)
Sodium: 131 mmol/L — ABNORMAL LOW (ref 135–145)

## 2019-03-22 LAB — SEDIMENTATION RATE: Sed Rate: 18 mm/hr (ref 0–22)

## 2019-03-22 LAB — CBC
HCT: 40.8 % (ref 36.0–46.0)
Hemoglobin: 13.5 g/dL (ref 12.0–15.0)
MCH: 29.2 pg (ref 26.0–34.0)
MCHC: 33.1 g/dL (ref 30.0–36.0)
MCV: 88.1 fL (ref 80.0–100.0)
Platelets: 271 10*3/uL (ref 150–400)
RBC: 4.63 MIL/uL (ref 3.87–5.11)
RDW: 13 % (ref 11.5–15.5)
WBC: 6.2 10*3/uL (ref 4.0–10.5)
nRBC: 0 % (ref 0.0–0.2)

## 2019-03-22 LAB — CREATININE, SERUM
Creatinine, Ser: 0.94 mg/dL (ref 0.44–1.00)
GFR calc Af Amer: >60 mL/min (ref >60)
GFR calc non Af Amer: 56 mL/min — ABNORMAL LOW (ref >60)

## 2019-03-22 LAB — SARS CORONAVIRUS 2 BY RT PCR (HOSPITAL ORDER, PERFORMED IN ~~LOC~~ HOSPITAL LAB): SARS Coronavirus 2: NEGATIVE

## 2019-03-22 MED ORDER — FERROUS SULFATE 325 (65 FE) MG PO TABS
325.0000 mg | ORAL_TABLET | Freq: Two times a day (BID) | ORAL | Status: DC
  Administered 2019-03-23 – 2019-03-26 (×8): 325 mg via ORAL
  Filled 2019-03-22 (×8): qty 1

## 2019-03-22 MED ORDER — CITALOPRAM HYDROBROMIDE 10 MG PO TABS: 20.0000 mg | ORAL_TABLET | Freq: Every day | ORAL | Status: DC

## 2019-03-22 MED ORDER — MELATONIN 3 MG PO TABS
1.0000 | ORAL_TABLET | Freq: Every day | ORAL | Status: DC
  Administered 2019-03-24 – 2019-03-25 (×2): 3 mg via ORAL
  Filled 2019-03-22 (×4): qty 1

## 2019-03-22 MED ORDER — ACETAMINOPHEN 325 MG PO TABS: 650.0000 mg | ORAL_TABLET | Freq: Four times a day (QID) | ORAL | Status: DC | PRN

## 2019-03-22 MED ORDER — CYCLOSPORINE 0.05 % OP EMUL: 1.0000 [drp] | Freq: Two times a day (BID) | OPHTHALMIC | Status: DC

## 2019-03-22 MED ORDER — RISPERIDONE 0.5 MG PO TABS
0.5000 mg | ORAL_TABLET | Freq: Every day | ORAL | Status: DC
  Administered 2019-03-24 – 2019-03-25 (×2): 0.5 mg via ORAL
  Filled 2019-03-22 (×4): qty 1

## 2019-03-22 MED ORDER — SODIUM CHLORIDE 0.9 % IV BOLUS
1000.0000 mL | Freq: Once | INTRAVENOUS | Status: AC
  Administered 2019-03-22: 1000 mL via INTRAVENOUS

## 2019-03-22 MED ORDER — ASPIRIN 325 MG PO TABS
325.0000 mg | ORAL_TABLET | Freq: Every day | ORAL | Status: DC
  Administered 2019-03-23 – 2019-03-26 (×4): 325 mg via ORAL
  Filled 2019-03-22 (×4): qty 1

## 2019-03-22 MED ORDER — METOPROLOL TARTRATE 25 MG PO TABS
100.0000 mg | ORAL_TABLET | Freq: Every day | ORAL | Status: DC
  Administered 2019-03-23 – 2019-03-26 (×4): 100 mg via ORAL
  Filled 2019-03-22 (×4): qty 4

## 2019-03-22 MED ORDER — PANTOPRAZOLE SODIUM 40 MG PO TBEC
40.0000 mg | DELAYED_RELEASE_TABLET | Freq: Every day | ORAL | Status: DC
  Administered 2019-03-23 – 2019-03-26 (×4): 40 mg via ORAL
  Filled 2019-03-22 (×4): qty 1

## 2019-03-22 MED ORDER — DICLOFENAC SODIUM 75 MG PO TBEC: 75.0000 mg | DELAYED_RELEASE_TABLET | Freq: Two times a day (BID) | ORAL | Status: DC

## 2019-03-22 MED ORDER — SODIUM CHLORIDE 0.9 % IV SOLN
1.0000 g | Freq: Once | INTRAVENOUS | Status: AC
  Administered 2019-03-22: 1 g via INTRAVENOUS
  Filled 2019-03-22: qty 10

## 2019-03-22 MED ORDER — FLUORESCEIN SODIUM 1 MG OP STRP
1.0000 | ORAL_STRIP | Freq: Once | OPHTHALMIC | Status: AC
  Administered 2019-03-22: 1 via OPHTHALMIC
  Filled 2019-03-22: qty 1

## 2019-03-22 MED ORDER — ENOXAPARIN SODIUM 40 MG/0.4ML ~~LOC~~ SOLN
40.0000 mg | Freq: Every day | SUBCUTANEOUS | Status: DC
  Administered 2019-03-23 – 2019-03-26 (×4): 40 mg via SUBCUTANEOUS
  Filled 2019-03-22 (×4): qty 0.4

## 2019-03-22 MED ORDER — ACETAMINOPHEN 650 MG RE SUPP: 650.0000 mg | Freq: Four times a day (QID) | RECTAL | Status: DC | PRN

## 2019-03-22 MED ORDER — LEVOTHYROXINE SODIUM 50 MCG PO TABS
50.0000 ug | ORAL_TABLET | Freq: Every day | ORAL | Status: DC
  Administered 2019-03-24 – 2019-03-26 (×3): 50 ug via ORAL
  Filled 2019-03-22 (×3): qty 1

## 2019-03-22 MED ORDER — TETRACAINE HCL 0.5 % OP SOLN
2.0000 [drp] | Freq: Once | OPHTHALMIC | Status: AC
  Administered 2019-03-22: 2 [drp] via OPHTHALMIC
  Filled 2019-03-22: qty 4

## 2019-03-22 MED ORDER — SENNA 8.6 MG PO TABS
1.0000 | ORAL_TABLET | Freq: Two times a day (BID) | ORAL | Status: DC
  Administered 2019-03-23 – 2019-03-26 (×6): 8.6 mg via ORAL
  Filled 2019-03-22 (×7): qty 1

## 2019-03-22 MED ORDER — DEXTROSE-NACL 5-0.45 % IV SOLN
INTRAVENOUS | Status: DC
  Administered 2019-03-22 – 2019-03-23 (×3): via INTRAVENOUS

## 2019-03-22 MED ORDER — ATORVASTATIN CALCIUM 40 MG PO TABS
40.0000 mg | ORAL_TABLET | Freq: Every day | ORAL | Status: DC
  Administered 2019-03-23 – 2019-03-25 (×3): 40 mg via ORAL
  Filled 2019-03-22 (×3): qty 1

## 2019-03-22 NOTE — ED Notes (Signed)
Pt daughter at bedside. States the pt is not her baseline, she has been in isolation for 2 weeks. States she is talking out of her head and seems confused.

## 2019-03-22 NOTE — Discharge Instructions (Addendum)
You were seen in the emergency department for right-sided headache and right eye pain.  You had a CAT scan of your head that redemonstrated your carotid aneurysm.  Your eye tests were otherwise unremarkable.  The patient's daughter was informed of this and asked that she be treated for pain with Tylenol or NSAIDs.  She is also going to touch base with her neurosurgeon if there is any indication for surgery.

## 2019-03-22 NOTE — ED Notes (Signed)
ED TO INPATIENT HANDOFF REPORT  ED Nurse Name and Phone #: Percell Locus, RN   S Name/Age/Gender Sarah Acevedo Other 85 y.o. female Room/Bed: 014C/014C  Code Status   Code Status: Full Code  Home/SNF/Other Skilled nursing facility Patient oriented to: self and place Is this baseline? Yes   Triage Complete: Triage complete  Chief Complaint eye pain  Triage Note Pt here from [facility] with c/o right eye pain swollen , no drainage    Allergies Allergies  Allergen Reactions  . Contrast Media [Iodinated Diagnostic Agents] Hives  . Iodine Hives    Level of Care/Admitting Diagnosis ED Disposition    ED Disposition Condition Sarah Acevedo Hospital Area: [place] [[id number]]  Level of Care: Med-Surg [16]  I expect the patient will be discharged within 24 hours: No (not a candidate for 5C-Observation unit)  Covid Evaluation: Asymptomatic Screening Protocol (No Symptoms)  Diagnosis: Failure to thrive in adult [761950]  Admitting Physician: Neena Rhymes [[id number]]  Attending Physician: Adella Hare E [[id number]]  PT Class (Do Not Modify): Observation [104]  PT Acc Code (Do Not Modify): Observation [10022]       B Medical/Surgery History Past Medical History:  Diagnosis Date  . Anxiety   . Basal cell carcinoma   . Bone spur   . Cancer ([place])   . Depression   . Hypertension   . Hypothyroidism   . Ruptured disk    Past Surgical History:  Procedure Laterality Date  . CESAREAN SECTION    . TMJ ARTHROPLASTY    . TONSILLECTOMY       A IV Location/Drains/Wounds Patient Lines/Drains/Airways Status   Active Line/Drains/Airways    Name:   Placement date:   Placement time:   Site:   Days:   Peripheral IV 03/22/19 Right Antecubital   03/22/19    1440    Antecubital   less than 1          Intake/Output Last 24 hours  Intake/Output Summary (Last 24 hours) at 03/22/2019 1837 Last data filed at 03/22/2019 1710 Gross per 24 hour  Intake -  Output 300  ml  Net -300 ml    Labs/Imaging Results for orders placed or performed during the hospital encounter of 03/22/19 (from the past 48 hour(s))  Basic metabolic panel     Status: Abnormal   Collection Time: 03/22/19 12:24 PM  Result Value Ref Range   Sodium 131 (L) 135 - 145 mmol/L   Potassium 3.9 3.5 - 5.1 mmol/L   Chloride 98 98 - 111 mmol/L   CO2 22 22 - 32 mmol/L   Glucose, Bld 115 (H) 70 - 99 mg/dL   BUN 24 (H) 8 - 23 mg/dL   Creatinine, Ser 1.02 (H) 0.44 - 1.00 mg/dL   Calcium 9.1 8.9 - 10.3 mg/dL   GFR calc non Af Amer 51 (L) >60 mL/min   GFR calc Af Amer 59 (L) >60 mL/min   Anion gap 11 5 - 15    Comment: Performed at [facility], [address], [place], [place] [address]  CBC with Differential     Status: None   Collection Time: 03/22/19 12:24 PM  Result Value Ref Range   WBC 7.8 4.0 - 10.5 K/uL   RBC 4.70 3.87 - 5.11 MIL/uL   Hemoglobin 13.7 12.0 - 15.0 g/dL   HCT 40.9 36.0 - 46.0 %   MCV 87.0 80.0 - 100.0 fL   MCH 29.1 26.0 - 34.0  pg   MCHC 33.5 30.0 - 36.0 g/dL   RDW 13.0 11.5 - 15.5 %   Platelets 311 150 - 400 K/uL   nRBC 0.0 0.0 - 0.2 %   Neutrophils Relative % 63 %   Neutro Abs 4.9 1.7 - 7.7 K/uL   Lymphocytes Relative 22 %   Lymphs Abs 1.7 0.7 - 4.0 K/uL   Monocytes Relative 12 %   Monocytes Absolute 0.9 0.1 - 1.0 K/uL   Eosinophils Relative 2 %   Eosinophils Absolute 0.2 0.0 - 0.5 K/uL   Basophils Relative 1 %   Basophils Absolute 0.1 0.0 - 0.1 K/uL   Immature Granulocytes 0 %   Abs Immature Granulocytes 0.03 0.00 - 0.07 K/uL    Comment: Performed at [facility] [address] [place], [place] [address]  Sedimentation rate     Status: None   Collection Time: 03/22/19 12:24 PM  Result Value Ref Range   Sed Rate 18 0 - 22 mm/hr    Comment: Performed at [facility], [place] [address], [place], [place] [address]  Urinalysis, Routine w reflex microscopic     Status: Abnormal   Collection Time: 03/22/19  5:06 PM  Result Value Ref Range    Color, Urine YELLOW YELLOW   APPearance HAZY (A) CLEAR   Specific Gravity, Urine 1.011 1.005 - 1.030   pH 5.0 5.0 - 8.0   Glucose, UA NEGATIVE NEGATIVE mg/dL   Hgb urine dipstick NEGATIVE NEGATIVE   Bilirubin Urine NEGATIVE NEGATIVE   Ketones, ur NEGATIVE NEGATIVE mg/dL   Protein, ur NEGATIVE NEGATIVE mg/dL   Nitrite NEGATIVE NEGATIVE   Leukocytes,Ua TRACE (A) NEGATIVE   RBC / HPF 0-5 0 - 5 RBC/hpf   WBC, UA 11-20 0 - 5 WBC/hpf   Bacteria, UA MANY (A) NONE SEEN   Squamous Epithelial / LPF 0-5 0 - 5   Mucus PRESENT     Comment: Performed at [facility], [address] [place], [place] [address]   Ct Head Wo Contrast  Result Date: 03/22/2019 CLINICAL DATA:  85 year old nursing home patient presenting with RIGHT eye pain and swelling. There is no drainage from the RIGHT eye. EXAM: CT HEAD AND ORBITS WITHOUT CONTRAST TECHNIQUE: Contiguous axial images were obtained from the base of the skull through the vertex without contrast. Multidetector CT imaging of the orbits was performed using the standard protocol without intravenous contrast. A metallic BB was placed on the right temple in order to reliably differentiate right from left. COMPARISON:  Head CT 07/17/2014. No prior orbital CT. FINDINGS: CT HEAD FINDINGS Brain: Ventricular system normal in size and appearance for age. Mild to moderate age related cortical atrophy, mildly progressive since 2015. Mild changes of small vessel disease of the white matter diffusely, unchanged. No midline shift. No acute hemorrhage or hematoma. No extra-axial fluid collections. No evidence of acute infarction. Approximate 2.6 x 2.8 x 2.3 cm heterogeneous mass with its epicenter in the RIGHT cavernous sinus, partially calcified. No masses elsewhere. Vascular: Severe BILATERAL carotid siphon and mild LEFT vertebral artery atherosclerosis. No hyperdense vessel. Skull: No skull fracture or other focal osseous abnormality involving the skull. Other: None. CT  ORBITS FINDINGS Orbits: No orbital fractures. No evidence of orbital hemorrhage or hematoma. No acute inflammatory changes involving the RIGHT orbital fat. Ocular muscles normal in appearance and symmetric bilaterally. Dysconjugate gaze is suspected. Hyperdense RIGHT lens compared to that on the LEFT. Visualized sinuses: Mucous retention cyst or polyp  in the LEFT maxillary sinus. Remaining visualized paranasal sinuses, BILATERAL mastoid air cells and BILATERAL middle ear cavities well-aerated. Soft tissues: Unremarkable. IMPRESSION: 1. No acute intracranial abnormality. 2. Mild to moderate age related cortical atrophy and mild chronic microvascular ischemic changes of the white matter diffusely. 3. 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 4. No orbital fractures identified. 5. Hyperdense RIGHT lens compared to that on the LEFT, query cataract. 6. Mild chronic left maxillary sinus disease. MRI of the brain without and with contrast is recommended in further evaluation to better characterize the mass. Electronically Signed   By: Evangeline Dakin M.D.   On: 03/22/2019 12:40   Ct Orbits Wo Contrast  Result Date: 03/22/2019 CLINICAL DATA:  85 year old nursing home patient presenting with RIGHT eye pain and swelling. There is no drainage from the RIGHT eye. EXAM: CT HEAD AND ORBITS WITHOUT CONTRAST TECHNIQUE: Contiguous axial images were obtained from the base of the skull through the vertex without contrast. Multidetector CT imaging of the orbits was performed using the standard protocol without intravenous contrast. A metallic BB was placed on the right temple in order to reliably differentiate right from left. COMPARISON:  Head CT 07/17/2014. No prior orbital CT. FINDINGS: CT HEAD FINDINGS Brain: Ventricular system normal in size and appearance for age. Mild to  moderate age related cortical atrophy, mildly progressive since 2015. Mild changes of small vessel disease of the white matter diffusely, unchanged. No midline shift. No acute hemorrhage or hematoma. No extra-axial fluid collections. No evidence of acute infarction. Approximate 2.6 x 2.8 x 2.3 cm heterogeneous mass with its epicenter in the RIGHT cavernous sinus, partially calcified. No masses elsewhere. Vascular: Severe BILATERAL carotid siphon and mild LEFT vertebral artery atherosclerosis. No hyperdense vessel. Skull: No skull fracture or other focal osseous abnormality involving the skull. Other: None. CT ORBITS FINDINGS Orbits: No orbital fractures. No evidence of orbital hemorrhage or hematoma. No acute inflammatory changes involving the RIGHT orbital fat. Ocular muscles normal in appearance and symmetric bilaterally. Dysconjugate gaze is suspected. Hyperdense RIGHT lens compared to that on the LEFT. Visualized sinuses: Mucous retention cyst or polyp in the LEFT maxillary sinus. Remaining visualized paranasal sinuses, BILATERAL mastoid air cells and BILATERAL middle ear cavities well-aerated. Soft tissues: Unremarkable. IMPRESSION: 1. No acute intracranial abnormality. 2. Mild to moderate age related cortical atrophy and mild chronic microvascular ischemic changes of the white matter diffusely. 3. 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 4. No orbital fractures identified. 5. Hyperdense RIGHT lens compared to that on the LEFT, query cataract. 6. Mild chronic left maxillary sinus disease. MRI of the brain without and with contrast is recommended in further evaluation to better characterize the mass. Electronically Signed   By: Evangeline Dakin M.D.   On: 03/22/2019 12:40    Pending Labs Unresulted Labs (From admission, onward)    Start     Ordered   03/29/19  0500  Creatinine, serum  (enoxaparin (LOVENOX)    CrCl >/= 30 ml/min)  Weekly,   R    Comments: while on enoxaparin therapy    03/22/19 1829   03/23/19 7989  Basic metabolic panel  Tomorrow morning,   R     03/22/19 1829   03/22/19 1827  CBC  (enoxaparin (LOVENOX)    CrCl >/= 30 ml/min)  Once,   STAT    Comments: Baseline for enoxaparin therapy IF NOT ALREADY DRAWN.  Notify MD if PLT < 100 K.    03/22/19 1829   03/22/19 1827  Creatinine, serum  (enoxaparin (LOVENOX)    CrCl >/= 30 ml/min)  Once,   STAT    Comments: Baseline for enoxaparin therapy IF NOT ALREADY DRAWN.    03/22/19 1829   03/22/19 1415  Urine culture  ONCE - STAT,   STAT     03/22/19 1415          Vitals/Pain Today's Vitals   03/22/19 1400 03/22/19 1607 03/22/19 1615 03/22/19 1645  BP: (!) 145/74 (!) 149/64 (!) 141/68 137/84  Pulse:  (!) 55 (!) 57 62  Resp:  14  14  Temp:  98.1 F (36.7 C)    TempSrc:  Oral    SpO2:  98% 100% 99%  PainSc:        Isolation Precautions No active isolations  Medications Medications  tetracaine (PONTOCAINE) 0.5 % ophthalmic solution 2 drop (has no administration in time range)  fluorescein ophthalmic strip 1 strip (has no administration in time range)  cefTRIAXone (ROCEPHIN) 1 g in sodium chloride 0.9 % 100 mL IVPB (has no administration in time range)  aspirin tablet 325 mg (has no administration in time range)  diclofenac (VOLTAREN) EC tablet 75 mg (has no administration in time range)  atorvastatin (LIPITOR) tablet 40 mg (has no administration in time range)  metoprolol tartrate (LOPRESSOR) tablet 100 mg (has no administration in time range)  citalopram (CELEXA) tablet 20 mg (has no administration in time range)  risperiDONE (RISPERDAL) tablet 0.5 mg (has no administration in time range)  levothyroxine (SYNTHROID) tablet 50 mcg (has no administration in time range)  pantoprazole (PROTONIX) EC tablet 40 mg (has no administration in time range)  ferrous sulfate tablet 325  mg (has no administration in time range)  Melatonin TABS 3 mg (has no administration in time range)  cycloSPORINE (RESTASIS) 0.05 % ophthalmic emulsion 1 drop (has no administration in time range)  enoxaparin (LOVENOX) injection 40 mg (has no administration in time range)  dextrose 5 %-0.45 % sodium chloride infusion (has no administration in time range)  acetaminophen (TYLENOL) tablet 650 mg (has no administration in time range)    Or  acetaminophen (TYLENOL) suppository 650 mg (has no administration in time range)  senna (SENOKOT) tablet 8.6 mg (has no administration in time range)  sodium chloride 0.9 % bolus 1,000 mL (1,000 mLs Intravenous New Bag/Given 03/22/19 1440)    Mobility walks with person assist High fall risk   Focused Assessments Neuro Assessment Handoff:  Swallow screen pass? Yes              R Recommendations: See Admitting Provider Note  Report given to:   Additional Notes:

## 2019-03-22 NOTE — ED Notes (Signed)
Attempted in and out cath. Pt had clear anatomy, no urine return noted.

## 2019-03-22 NOTE — ED Triage Notes (Signed)
Pt here from [facility] with c/o right eye pain swollen , no drainage

## 2019-03-22 NOTE — ED Provider Notes (Signed)
Patient CARE signed out to follow-up urinalysis and reassess.  Patient presented for head discomfort had CT scans and work-up for which she has outpatient follow-up for.  Discussed with daughter and primary concern is deconditioning, worsening confusion since being in isolation and not ambulatory for 2 weeks.  Urinalysis possible urine infection Rocephin and culture obtained.  Patient generally weak on exam.  Patient has mild confusion on exam.  Discussed with hospitalist for admission for IV fluids, antibiotics and possibly rehab placement.  Patient normally in assisted living in 2 weeks ago was walking around.  Mariea Clonts  The patients results and plan were reviewed and discussed.   Any x-rays performed were independently reviewed by myself.   Differential diagnosis were considered with the presenting HPI.  Medications  tetracaine (PONTOCAINE) 0.5 % ophthalmic solution 2 drop (has no administration in time range)  fluorescein ophthalmic strip 1 strip (has no administration in time range)  cefTRIAXone (ROCEPHIN) 1 g in sodium chloride 0.9 % 100 mL IVPB (has no administration in time range)  aspirin tablet 325 mg (has no administration in time range)  diclofenac (VOLTAREN) EC tablet 75 mg (has no administration in time range)  atorvastatin (LIPITOR) tablet 40 mg (has no administration in time range)  metoprolol tartrate (LOPRESSOR) tablet 100 mg (has no administration in time range)  citalopram (CELEXA) tablet 20 mg (has no administration in time range)  risperiDONE (RISPERDAL) tablet 0.5 mg (has no administration in time range)  levothyroxine (SYNTHROID) tablet 50 mcg (has no administration in time range)  pantoprazole (PROTONIX) EC tablet 40 mg (has no administration in time range)  ferrous sulfate tablet 325 mg (has no administration in time range)  Melatonin TABS 3 mg (has no administration in time range)  cycloSPORINE (RESTASIS) 0.05 % ophthalmic emulsion 1 drop (has no  administration in time range)  enoxaparin (LOVENOX) injection 40 mg (has no administration in time range)  dextrose 5 %-0.45 % sodium chloride infusion (has no administration in time range)  acetaminophen (TYLENOL) tablet 650 mg (has no administration in time range)    Or  acetaminophen (TYLENOL) suppository 650 mg (has no administration in time range)  senna (SENOKOT) tablet 8.6 mg (has no administration in time range)  sodium chloride 0.9 % bolus 1,000 mL (1,000 mLs Intravenous New Bag/Given 03/22/19 1440)    Vitals:   03/22/19 1400 03/22/19 1607 03/22/19 1615 03/22/19 1645  BP: (!) 145/74 (!) 149/64 (!) 141/68 137/84  Pulse:  (!) 55 (!) 57 62  Resp:  14  14  Temp:  98.1 F (36.7 C)    TempSrc:  Oral    SpO2:  98% 100% 99%    Final diagnoses:  Acute right eye pain  Carotid artery aneurysm (HCC)  Failure to thrive in adult  Poor tolerance for ambulation  Acute renal failure, unspecified acute renal failure type (HCC)  Acute cystitis without hematuria  Confusion    Admission/ observation were discussed with the admitting physician, patient and/or family and they are comfortable with the plan.     Elnora Morrison, MD 03/22/19 [phone]

## 2019-03-22 NOTE — ED Notes (Signed)
Patient transported to CT 

## 2019-03-22 NOTE — ED Provider Notes (Signed)
[facility] EMERGENCY DEPARTMENT Provider Note   CSN: [id number] Arrival date & time: 03/22/19  1111     History   Chief Complaint Chief Complaint  Patient presents with   Eye Problem    HPI Sarah Acevedo is a 85 y.o. female.  She is a very poor historian.  She is coming in from [place] for evaluation of right eye swelling and pain.  She said it is been going on for 3 days and started on the left eye and now is in her right eye.  She denies any trauma but cannot really tell me anything else more about it.  She said her right eye does not move to some prior problem but she cannot tell me what.     The history is provided by the patient.  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      Home Medications    Prior to Admission medications   Medication Sig Start Date End Date Taking? Authorizing Provider  ALPRAZolam Duanne Moron) 0.5 MG tablet Take 0.5 mg by mouth as directed. 0.25 mg tablet 3 times daily as needed for sleep and 0.5 at bedtime (scheduled)    [provider]  aspirin 325 MG tablet Take 325 mg by mouth daily.    [provider]  atorvastatin (LIPITOR) 40 MG tablet Take 1 tablet (40 mg  total) by mouth at bedtime. 07/23/14   Rankin, Shuvon B, NP  citalopram (CELEXA) 20 MG tablet Take 20 mg by mouth daily.    [provider]  cycloSPORINE (RESTASIS) 0.05 % ophthalmic emulsion Place 1 drop into both eyes 2 (two) times daily.    [provider]  diclofenac (VOLTAREN) 75 MG EC tablet Take 75 mg by mouth 2 (two) times daily.    [provider]  ferrous sulfate 325 (65 FE) MG tablet Take 325 mg by mouth 2 (two) times daily with a meal.    [provider]  levothyroxine (SYNTHROID, LEVOTHROID) 50 MCG tablet Take 50 mcg by mouth daily.    [provider]  Melatonin 3 MG TABS Take 1 tablet by mouth at bedtime.    [provider]  metoprolol (LOPRESSOR) 100 MG tablet Take 100 mg by mouth daily.    [provider]  Multiple Vitamins-Minerals (ICAPS PO) Take 1 capsule by mouth daily.    [provider]  omeprazole (PRILOSEC) 20 MG capsule Take 20 mg by mouth daily.    [provider]  risperiDONE (RISPERDAL) 0.5 MG tablet Take 1 tablet (0.5 mg total) by mouth at bedtime. 07/23/14  Rankin, Shuvon B, NP  simvastatin (ZOCOR) 40 MG tablet Take 40 mg by mouth every evening. 05/27/14   [provider]    Family History No family history on file.  Social History Social History   Tobacco Use   Smoking status: Former Smoker  Substance Use Topics   Alcohol use: No   Drug use: No     Allergies   Contrast media [iodinated diagnostic agents] and Iodine   Review of Systems Review of Systems  Constitutional: Negative for fever.  HENT: Negative for sore throat.   Eyes: Positive for pain.  Respiratory: Negative for shortness of breath.   Cardiovascular: Negative for chest pain.  Gastrointestinal: Negative for abdominal pain, nausea and vomiting.  Genitourinary: Negative for dysuria.  Musculoskeletal: Negative for neck pain.  Skin: Negative for rash.  Neurological: Positive for headaches.  Negative for speech difficulty.     Physical Exam Updated Vital Signs BP 128/71    Pulse (!) 55    Temp 98 F (36.7 C) (Oral)    SpO2 99%   Physical Exam Vitals signs and nursing note reviewed.  Constitutional:      General: She is not in acute distress.    Appearance: She is well-developed.  HENT:     Head: Normocephalic and atraumatic.  Eyes:     Conjunctiva/sclera: Conjunctivae normal.     Comments: There is some mild swelling around the right eye.  No erythema.  She is got an inward gaze on the right eye with no movement with extraocular movement.  Anterior chamber is clear.  Pupil appears reactive with light.  Left eye unremarkable.  Neck:     Musculoskeletal: Neck supple.  Cardiovascular:     Rate and Rhythm: Normal rate and regular rhythm.     Heart sounds: No murmur.  Pulmonary:     Effort: Pulmonary effort is normal. No respiratory distress.     Breath sounds: Normal breath sounds.  Abdominal:     Palpations: Abdomen is soft.     Tenderness: There is no abdominal tenderness.  Skin:    General: Skin is warm and dry.     Capillary Refill: Capillary refill takes less than 2 seconds.  Neurological:     Mental Status: She is alert. She is disoriented.     Comments: Patient is awake and answers some questions. Confused at times. Moving all extremities and intact sensation.       ED Treatments / Results  Labs (all labs ordered are listed, but only abnormal results are displayed) Labs Reviewed  BASIC METABOLIC PANEL - Abnormal; Notable for the following components:      Result Value   Sodium 131 (*)    Glucose, Bld 115 (*)    BUN 24 (*)    Creatinine, Ser 1.02 (*)    GFR calc non Af Amer 51 (*)    GFR calc Af Amer 59 (*)    All other components within normal limits  URINALYSIS, ROUTINE W REFLEX MICROSCOPIC - Abnormal; Notable for the following components:   APPearance HAZY (*)    Leukocytes,Ua TRACE (*)    Bacteria, UA MANY (*)    All other components within  normal limits  CREATININE, SERUM - Abnormal; Notable for the following components:   GFR calc non Af Amer 56 (*)    All other components within normal limits  BASIC METABOLIC PANEL - Abnormal; Notable for the following components:   Sodium 131 (*)    CO2 20 (*)  Glucose, Bld 115 (*)    Calcium 8.5 (*)    All other components within normal limits  SARS CORONAVIRUS 2 (HOSPITAL ORDER, [facility])  URINE CULTURE  CBC WITH DIFFERENTIAL/PLATELET  SEDIMENTATION RATE  CBC    EKG None  Radiology Ct Head Wo Contrast  Result Date: 03/22/2019 CLINICAL DATA:  85 year old nursing home patient presenting with RIGHT eye pain and swelling. There is no drainage from the RIGHT eye. EXAM: CT HEAD AND ORBITS WITHOUT CONTRAST TECHNIQUE: Contiguous axial images were obtained from the base of the skull through the vertex without contrast. Multidetector CT imaging of the orbits was performed using the standard protocol without intravenous contrast. A metallic BB was placed on the right temple in order to reliably differentiate right from left. COMPARISON:  Head CT 07/17/2014. No prior orbital CT. FINDINGS: CT HEAD FINDINGS Brain: Ventricular system normal in size and appearance for age. Mild to moderate age related cortical atrophy, mildly progressive since 2015. Mild changes of small vessel disease of the white matter diffusely, unchanged. No midline shift. No acute hemorrhage or hematoma. No extra-axial fluid collections. No evidence of acute infarction. Approximate 2.6 x 2.8 x 2.3 cm heterogeneous mass with its epicenter in the RIGHT cavernous sinus, partially calcified. No masses elsewhere. Vascular: Severe BILATERAL carotid siphon and mild LEFT vertebral artery atherosclerosis. No hyperdense vessel. Skull: No skull fracture or other focal osseous abnormality involving the skull. Other: None. CT ORBITS FINDINGS Orbits: No orbital fractures. No evidence of orbital hemorrhage or  hematoma. No acute inflammatory changes involving the RIGHT orbital fat. Ocular muscles normal in appearance and symmetric bilaterally. Dysconjugate gaze is suspected. Hyperdense RIGHT lens compared to that on the LEFT. Visualized sinuses: Mucous retention cyst or polyp in the LEFT maxillary sinus. Remaining visualized paranasal sinuses, BILATERAL mastoid air cells and BILATERAL middle ear cavities well-aerated. Soft tissues: Unremarkable. IMPRESSION: 1. No acute intracranial abnormality. 2. Mild to moderate age related cortical atrophy and mild chronic microvascular ischemic changes of the white matter diffusely. 3. 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 4. No orbital fractures identified. 5. Hyperdense RIGHT lens compared to that on the LEFT, query cataract. 6. Mild chronic left maxillary sinus disease. MRI of the brain without and with contrast is recommended in further evaluation to better characterize the mass. Electronically Signed   By: Evangeline Dakin M.D.   On: 03/22/2019 12:40   Ct Orbits Wo Contrast  Result Date: 03/22/2019 CLINICAL DATA:  85 year old nursing home patient presenting with RIGHT eye pain and swelling. There is no drainage from the RIGHT eye. EXAM: CT HEAD AND ORBITS WITHOUT CONTRAST TECHNIQUE: Contiguous axial images were obtained from the base of the skull through the vertex without contrast. Multidetector CT imaging of the orbits was performed using the standard protocol without intravenous contrast. A metallic BB was placed on the right temple in order to reliably differentiate right from left. COMPARISON:  Head CT 07/17/2014. No prior orbital CT. FINDINGS: CT HEAD FINDINGS Brain: Ventricular system normal in size and appearance for age. Mild to moderate age related cortical atrophy, mildly progressive since 2015. Mild changes of  small vessel disease of the white matter diffusely, unchanged. No midline shift. No acute hemorrhage or hematoma. No extra-axial fluid collections. No evidence of acute infarction. Approximate 2.6 x 2.8 x 2.3 cm heterogeneous mass with its epicenter in  the RIGHT cavernous sinus, partially calcified. No masses elsewhere. Vascular: Severe BILATERAL carotid siphon and mild LEFT vertebral artery atherosclerosis. No hyperdense vessel. Skull: No skull fracture or other focal osseous abnormality involving the skull. Other: None. CT ORBITS FINDINGS Orbits: No orbital fractures. No evidence of orbital hemorrhage or hematoma. No acute inflammatory changes involving the RIGHT orbital fat. Ocular muscles normal in appearance and symmetric bilaterally. Dysconjugate gaze is suspected. Hyperdense RIGHT lens compared to that on the LEFT. Visualized sinuses: Mucous retention cyst or polyp in the LEFT maxillary sinus. Remaining visualized paranasal sinuses, BILATERAL mastoid air cells and BILATERAL middle ear cavities well-aerated. Soft tissues: Unremarkable. IMPRESSION: 1. No acute intracranial abnormality. 2. Mild to moderate age related cortical atrophy and mild chronic microvascular ischemic changes of the white matter diffusely. 3. 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 4. No orbital fractures identified. 5. Hyperdense RIGHT lens compared to that on the LEFT, query cataract. 6. Mild chronic left maxillary sinus disease. MRI of the brain without and with contrast is recommended in further evaluation to better characterize the mass. Electronically Signed   By: Evangeline Dakin M.D.   On: 03/22/2019 12:40    Procedures Procedures (including critical care time)  Medications Ordered in ED Medications  tetracaine (PONTOCAINE) 0.5 % ophthalmic solution 2 drop (has no  administration in time range)  fluorescein ophthalmic strip 1 strip (has no administration in time range)     Initial Impression / Assessment and Plan / ED Course  I have reviewed the triage vital signs and the nursing notes.  Pertinent labs & imaging results that were available during my care of the patient were reviewed by me and considered in my medical decision making (see chart for details).  Clinical Course as of Mar 22 548  Sat Mar 22, 2019  1234 Patient here with right eye pain of possibly 2 or 3 days.  She is definitely got some element of entrapment or non-movement of that eye.  Per notes I think this is old.  I checked her pressure and it was 22 in that eye and she was not particularly relaxed when I did it so I think this is probably normal.  Fluorescein with no gross uptake.   [MB]  1302 Patient CTs do not show any acute intracranial abnormality.  They do comment upon an abnormality in the cavernous sinus that could be a partially calcified aneurysm.  On review of prior records this seems to have been documented before and had an angiogram done back in 2018.  The following is the note from the neurosurgeon. - HPI: Gladie Gravette is a 85 y.o. female patient who presents at the kind referral of Dr. Peter Congo for evaluation of brain aneurysms. Patient endorses a 1 yr history of her R eye turning in. She has known bilateral cavernous aneurysms. Ophthalmology evaluated the patient and recommended an MRI of the brain. MRI revealed diffuse LM thickening and enlarged R cavernous carotid aneurysm. Patient currently denies any complaints such as headache, neck stiffness. Endorse mild double vision at times. Of note, daughter states that the patient has been altered since her aneurysm diagnosis.    [MB]  6333 I spoke to the patient's daughter who is her Advice worker.  She said this is been a known problem for a couple years and they have seen a neurosurgeon and have been  waiting to  see if there is more problems before they consider doing a surgery.   [MB]  2552 Patient's ESR is in the normal range.  Her daughter said she has had some right eye pain and right-sided headaches on and off and this is what is pushing them to start seeing if she might end up requiring surgery for her aneurysms.  We will have her return to the nursing facility and have them keep an eye on her pain level.  The daughter said she was going to reach out to the neurosurgeon to see if there was can change any of her management decision.   [MB]  57 Patient's daughter is here now.  She said she seems a little more confused than a few weeks ago.  She is wondering if she might have a UTI or if it is related to her being so isolated at her facility without any family contact.  We can definitely check a urine.  She is asking for a copy of the CAT scan report so she can review this with the neurosurgeon.   [MB]  46 The nurses attempted to catheterize her but she did not have any in her bladder.  We will put an IV in her and give her some fluids.   [MB]    Clinical Course User Index [MB] Hayden Rasmussen, MD        Final Clinical Impressions(s) / ED Diagnoses   Final diagnoses:  Acute right eye pain  Carotid artery aneurysm (HCC)  Failure to thrive in adult  Poor tolerance for ambulation  Acute renal failure, unspecified acute renal failure type [facility])  Acute cystitis without hematuria  Confusion    ED Discharge Orders    None       Hayden Rasmussen, MD 03/23/19 [phone]

## 2019-03-22 NOTE — H&P (Signed)
History and Physical    Sarah Acevedo DGL:[id number] DOB: 1933/02/18 DOA: 03/22/2019  PCP: Jonathon Jordan, MD (Confirm with patient/family/NH records and if not entered, this has to be entered at [facility] point of entry) Patient coming from: Coming from a assisted living facility  I have personally briefly reviewed patient's old medical records in [place]  Chief Complaint: Increasing weakness and feeling ill  HPI: Sarah Acevedo is a 85 y.o. female with medical history significant of hypertension, hypothyroidism, major depression, lid lag right eye.  Patient has had increasing weakness and has difficulty ambulating, which is a change.  Prior to admission to skilled living 2 weeks ago patient had been independent in ADLs with supervision and moderate assistance. She was able to ambulate about her abode.  Patient reports feeling ill but has no specific, or focal complaint.  By her report she is not eating or drinking well.  She does report having lost weight,  confirmed by her daughter.   ED Course: ER evaluation included a normal CBC, she has mild renal insufficiency with a creatinine of 1.02 with a mildly elevated BUN at 24 that in conjunction with her history of poor p.o. intake indicates mild acute kidney injury secondary to dehydration.  Urinalysis was positive for bacteria and leukocytes.  She did receive 1 dose of Rocephin in the ED.  Culture is pending at admission.  Due to failure to thrive, AK I, and UTI patient is admitted on observation to a medical unit.  Review of Systems: As per HPI otherwise 10 point review of systems negative except for chronic dyspnea on exertion/shortness of breath.  She specifically denies chest pain or chest discomfort, nausea vomiting or change in bowel habit, musculoskeletal pain, discomfort or limitations.  She does report a recent episode of bright red hematochezia consistent with a history of hemorrhoids.  Patient does report mild headaches in the right  periorbital area.  She does have lid lag.  Is been seen and evaluated by neurosurgery for 2 small febrile aneurysms which are being observed at this time without indication for surgical intervention   Past Medical History:  Diagnosis Date   Anxiety    Basal cell carcinoma    Bone spur    Cancer (Micro)    Depression    Hypertension    Hypothyroidism    Ruptured disk     Past Surgical History:  Procedure Laterality Date   CESAREAN SECTION     TMJ ARTHROPLASTY     TONSILLECTOMY       reports that she has quit smoking. She does not have any smokeless tobacco history on file. She reports that she does not drink alcohol or use drugs.  Allergies  Allergen Reactions   Contrast Media [Iodinated Diagnostic Agents] Hives   Iodine Hives    No family history on file.  Based on side chart review there is no family history for cancer no family history for heart disease  Social history -high school graduate.  Patient was married x3: First marriage 20+ years ending in divorce, second marriage 1 year ending in being widowed, third marriage possibly 15 years ending in widowed.  She has 1 son with whom she had been living in [place] in her own home.  He has been diagnosed with esophageal cancer with brain mets and at this time was unable to continue to care for her.  She has 1 daughter in [place] with whom she moved in with but because of her increasing needs for  supervision and assistance was admitted to [facility] assisted living.  Over her approximately 2 weeks in the facility she had progressive weakness, weight loss and adult failure to thrive  Prior to Admission medications   Medication Sig Start Date End Date Taking? Authorizing Provider  ALPRAZolam Duanne Moron) 0.5 MG tablet Take 0.5 mg by mouth as directed. 0.25 mg tablet 3 times daily as needed for sleep and 0.5 at bedtime (scheduled)    [provider]  aspirin 325 MG tablet Take 325 mg by mouth daily.    [provider]  atorvastatin (LIPITOR) 40 MG tablet Take 1 tablet (40 mg total) by mouth at bedtime. 07/23/14   Rankin, Shuvon B, NP  citalopram (CELEXA) 20 MG tablet Take 20 mg by mouth daily.    [provider]  cycloSPORINE (RESTASIS) 0.05 % ophthalmic emulsion Place 1 drop into both eyes 2 (two) times daily.    [provider]  diclofenac (VOLTAREN) 75 MG EC tablet Take 75 mg by mouth 2 (two) times daily.    [provider]  ferrous sulfate 325 (65 FE) MG tablet Take 325 mg by mouth 2 (two) times daily with a meal.    [provider]  levothyroxine (SYNTHROID, LEVOTHROID) 50 MCG tablet Take 50 mcg by mouth daily.    [provider]  Melatonin 3 MG TABS Take 1 tablet by mouth at bedtime.    [provider]  metoprolol (LOPRESSOR) 100 MG tablet Take 100 mg by mouth daily.    [provider]  Multiple Vitamins-Minerals (ICAPS PO) Take 1 capsule by mouth daily.    [provider]  omeprazole (PRILOSEC) 20 MG capsule Take 20 mg by mouth daily.    [provider]  risperiDONE (RISPERDAL) 0.5 MG tablet Take 1 tablet (0.5 mg total) by mouth at bedtime. 07/23/14   Rankin, Shuvon B, NP  simvastatin (ZOCOR) 40 MG tablet Take 40 mg by mouth every evening. 05/27/14   [provider]    Physical Exam: Vitals:   03/22/19 1400 03/22/19 1607 03/22/19 1615 03/22/19 1645  BP: (!) 145/74 (!) 149/64 (!) 141/68 137/84  Pulse:  (!) 55 (!) 57 62  Resp:  14  14  Temp:  98.1 F (36.7 C)    TempSrc:  Oral    SpO2:  98% 100% 99%    Constitutional: NAD, calm, comfortable Vitals:   03/22/19 1400 03/22/19 1607 03/22/19 1615 03/22/19 1645  BP: (!) 145/74 (!) 149/64 (!) 141/68 137/84  Pulse:  (!) 55 (!) 57 62  Resp:  14  14  Temp:  98.1 F (36.7 C)    TempSrc:  Oral    SpO2:  98% 100% 99%   Appearance -pleasant elderly woman in no acute distress.   Eyes: PERRL, hazy cornea OS, examination of OD in [place] to lid lag.   conjunctivae normal ENMT: Mucous membranes are dry. Posterior pharynx clear of any exudate or lesions.  Edentulous with full dentures in place.  Neck: normal, supple, no masses, no thyromegaly Respiratory: clear to auscultation bilaterally, no wheezing, no crackles. Normal respiratory effort. No accessory muscle use.  Mildly tachypneic Cardiovascular: Regular rate and rhythm, no murmurs / rubs / gallops. No extremity edema. 2+ pedal pulses. No carotid bruits.  Abdomen: no tenderness, no masses palpated. No hepatosplenomegaly. Bowel sounds positive.  Musculoskeletal: no clubbing / cyanosis. No joint deformity upper and lower extremities except for bunion right MTP valgus deformity. Good ROM, no contractures.  Poor muscle tone.  Skin: no rashes, lesions, ulcers. No induration Neurologic: CN 2-12 grossly intact except for lid lag right eye. Sensation intact,Psychiatric: Normal judgment and insight. Alert and oriented x 3. Normal mood.     Labs on Admission: I have personally reviewed following labs and imaging studies  CBC: Recent Labs  Lab 03/22/19 1224  WBC 7.8  NEUTROABS 4.9  HGB 13.7  HCT 40.9  MCV 87.0  PLT 767   Basic Metabolic Panel: Recent Labs  Lab 03/22/19 1224  NA 131*  K 3.9  CL 98  CO2 22  GLUCOSE 115*  BUN 24*  CREATININE 1.02*  CALCIUM 9.1   GFR: CrCl cannot be calculated (Unknown ideal weight.). Liver Function Tests: No results for input(s): AST, ALT, ALKPHOS, BILITOT, PROT, ALBUMIN in the last 168 hours. No results for input(s): LIPASE, AMYLASE in the last 168 hours. No results for input(s): AMMONIA in the last 168 hours. Coagulation Profile: No results for input(s): INR, PROTIME in the last 168 hours. Cardiac Enzymes: No results for input(s): CKTOTAL, CKMB, CKMBINDEX, TROPONINI in the last 168 hours. BNP (last 3 results) No results for input(s): PROBNP in the last 8760 hours. HbA1C: No results for input(s): HGBA1C in the last 72 hours. CBG: No  results for input(s): GLUCAP in the last 168 hours. Lipid Profile: No results for input(s): CHOL, HDL, LDLCALC, TRIG, CHOLHDL, LDLDIRECT in the last 72 hours. Thyroid Function Tests: No results for input(s): TSH, T4TOTAL, FREET4, T3FREE, THYROIDAB in the last 72 hours. Anemia Panel: No results for input(s): VITAMINB12, FOLATE, FERRITIN, TIBC, IRON, RETICCTPCT in the last 72 hours. Urine analysis:    Component Value Date/Time   COLORURINE YELLOW 03/22/2019 1706   APPEARANCEUR HAZY (A) 03/22/2019 1706   LABSPEC 1.011 03/22/2019 1706   PHURINE 5.0 03/22/2019 1706   GLUCOSEU NEGATIVE 03/22/2019 1706   HGBUR NEGATIVE 03/22/2019 1706   BILIRUBINUR NEGATIVE 03/22/2019 1706   KETONESUR NEGATIVE 03/22/2019 1706   PROTEINUR NEGATIVE 03/22/2019 1706   UROBILINOGEN 1.0 07/22/2014 0315   NITRITE NEGATIVE 03/22/2019 1706   LEUKOCYTESUR TRACE (A) 03/22/2019 1706    Radiological Exams on Admission: Ct Head Wo Contrast  Result Date: 03/22/2019 CLINICAL DATA:  85 year old nursing home patient presenting with RIGHT eye pain and swelling. There is no drainage from the RIGHT eye. EXAM: CT HEAD AND ORBITS WITHOUT CONTRAST TECHNIQUE: Contiguous axial images were obtained from the base of the skull through the vertex without contrast. Multidetector CT imaging of the orbits was performed using the standard protocol without intravenous contrast. A metallic BB was placed on the right temple in order to reliably differentiate right from left. COMPARISON:  Head CT 07/17/2014. No prior orbital CT. FINDINGS: CT HEAD FINDINGS Brain: Ventricular system normal in size and appearance for age. Mild to moderate age related cortical atrophy, mildly progressive since 2015. Mild changes of small vessel disease of the white matter diffusely, unchanged. No midline shift. No acute hemorrhage or hematoma. No extra-axial fluid collections. No evidence of acute infarction. Approximate 2.6 x 2.8 x 2.3 cm heterogeneous mass with its  epicenter in the RIGHT cavernous sinus, partially calcified. No masses elsewhere. Vascular: Severe BILATERAL carotid siphon and mild LEFT vertebral artery atherosclerosis. No hyperdense vessel. Skull: No skull fracture or other focal osseous abnormality involving the skull. Other: None. CT ORBITS FINDINGS Orbits: No orbital fractures. No evidence of orbital hemorrhage or hematoma. No acute inflammatory changes involving the RIGHT orbital fat. Ocular muscles normal in appearance and symmetric bilaterally. Dysconjugate gaze is suspected. Hyperdense RIGHT lens  compared to that on the LEFT. Visualized sinuses: Mucous retention cyst or polyp in the LEFT maxillary sinus. Remaining visualized paranasal sinuses, BILATERAL mastoid air cells and BILATERAL middle ear cavities well-aerated. Soft tissues: Unremarkable. IMPRESSION: 1. No acute intracranial abnormality. 2. Mild to moderate age related cortical atrophy and mild chronic microvascular ischemic changes of the white matter diffusely. 3. 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 4. No orbital fractures identified. 5. Hyperdense RIGHT lens compared to that on the LEFT, query cataract. 6. Mild chronic left maxillary sinus disease. MRI of the brain without and with contrast is recommended in further evaluation to better characterize the mass. Electronically Signed   By: Evangeline Dakin M.D.   On: 03/22/2019 12:40   Ct Orbits Wo Contrast  Result Date: 03/22/2019 CLINICAL DATA:  85 year old nursing home patient presenting with RIGHT eye pain and swelling. There is no drainage from the RIGHT eye. EXAM: CT HEAD AND ORBITS WITHOUT CONTRAST TECHNIQUE: Contiguous axial images were obtained from the base of the skull through the vertex without contrast. Multidetector CT imaging of the orbits was performed using the standard  protocol without intravenous contrast. A metallic BB was placed on the right temple in order to reliably differentiate right from left. COMPARISON:  Head CT 07/17/2014. No prior orbital CT. FINDINGS: CT HEAD FINDINGS Brain: Ventricular system normal in size and appearance for age. Mild to moderate age related cortical atrophy, mildly progressive since 2015. Mild changes of small vessel disease of the white matter diffusely, unchanged. No midline shift. No acute hemorrhage or hematoma. No extra-axial fluid collections. No evidence of acute infarction. Approximate 2.6 x 2.8 x 2.3 cm heterogeneous mass with its epicenter in the RIGHT cavernous sinus, partially calcified. No masses elsewhere. Vascular: Severe BILATERAL carotid siphon and mild LEFT vertebral artery atherosclerosis. No hyperdense vessel. Skull: No skull fracture or other focal osseous abnormality involving the skull. Other: None. CT ORBITS FINDINGS Orbits: No orbital fractures. No evidence of orbital hemorrhage or hematoma. No acute inflammatory changes involving the RIGHT orbital fat. Ocular muscles normal in appearance and symmetric bilaterally. Dysconjugate gaze is suspected. Hyperdense RIGHT lens compared to that on the LEFT. Visualized sinuses: Mucous retention cyst or polyp in the LEFT maxillary sinus. Remaining visualized paranasal sinuses, BILATERAL mastoid air cells and BILATERAL middle ear cavities well-aerated. Soft tissues: Unremarkable. IMPRESSION: 1. No acute intracranial abnormality. 2. Mild to moderate age related cortical atrophy and mild chronic microvascular ischemic changes of the white matter diffusely. 3. 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 4. No orbital fractures identified. 5. Hyperdense RIGHT lens compared to that on the LEFT, query cataract. 6. Mild chronic left maxillary sinus  disease. MRI of the brain without and with contrast is recommended in further evaluation to better characterize the mass. Electronically Signed   By: Evangeline Dakin M.D.   On: 03/22/2019 12:40    EKG: No EKG available  Assessment/Plan Active Problems:   Failure to thrive in adult   Essential hypertension   Depression   Lipid disorder   AKI (acute kidney injury) ([place])   Hypothyroidism  (please populate well all problems here in Problem List. (For example, if patient is on BP meds at home and you resume or decide to  hold them, it is a problem that needs to be her. Same for CAD, COPD, HLD and so on)   1.  Failure to thrive in adult -per the patient, and her daughters, report the patient has significant failure to thrive over the past month.  Been living in her own home in [place] due to her son's illness and inability to care for her she moved to [place] to be with her daughter.  Daughter found her to require increasing assistance with ADLs and place her in an assisted living facility.  Over the succeeding 2 weeks the patient's had significant decline in mobility, significant decrease in p.o. intake along with some increased confusion.  The patient does have a pre-existing depression for which she is taking medication.  No report in old records of any dementia.  Patient now is unable to ambulate on her own. Plan [facility] observation admission  PT and OT evaluation to determine need for skilled care and rehabilitation.  We will continue the patient's home medications including antidepressants  2.  AKI-patient with mildly elevated BUN and creatinine consistent with a prerenal cause.  Back patient's decreased p.o. intake is led to mild dehydration. Plan gentle hydration with D5 half-normal saline  A.m. labs to include BUN and creatinine  3.  UTI -patient with mild UTI on urinalysis and microscopic exam.  He has received 1 dose of Rocephin in the ED. Plan follow-up urine culture and initiate  antibiotics as indicated  4.  Hypertension-we will continue home medications  5.  Hypothyroidism -we will continue home medication and obtain at TSH  6.  Aneurysms -this problem has been worked up as an outpatient.  She has consulted with neurosurgery.  The current plan is watchful waiting.  DVT prophylaxis: Lovenox (Lovenox/Heparin/SCD's/anticoagulated/None (if comfort care) Code Status:  DNR -discussed with the patient in the presence of her daughter Gaffer (specify details) Family Communication: Daughter, POA, informed of the patient's diagnosis and plan of care. all questions are answered.  She agrees with this plan.   Disposition Plan: Skilled care/rehabilitation facility in 66 to 72 hours Consults called: None (with names) Admission status: Observation medical (inpatient / obs / tele / medical floor / SDU)   Adella Hare MD [facility] Pager [phone]  If 7PM-7AM, please contact night-coverage [email or web address] Password [facility]  03/22/2019, 7:05 PM

## 2019-03-23 ENCOUNTER — Encounter (HOSPITAL_COMMUNITY): Payer: Self-pay

## 2019-03-23 DIAGNOSIS — E785 Hyperlipidemia, unspecified: Secondary | ICD-10-CM | POA: Diagnosis present

## 2019-03-23 DIAGNOSIS — Z681 Body mass index (BMI) 19 or less, adult: Secondary | ICD-10-CM | POA: Diagnosis not present

## 2019-03-23 DIAGNOSIS — H5711 Ocular pain, right eye: Secondary | ICD-10-CM | POA: Diagnosis present

## 2019-03-23 DIAGNOSIS — Z87891 Personal history of nicotine dependence: Secondary | ICD-10-CM | POA: Diagnosis not present

## 2019-03-23 DIAGNOSIS — I2510 Atherosclerotic heart disease of native coronary artery without angina pectoris: Secondary | ICD-10-CM | POA: Diagnosis present

## 2019-03-23 DIAGNOSIS — R410 Disorientation, unspecified: Secondary | ICD-10-CM | POA: Diagnosis present

## 2019-03-23 DIAGNOSIS — Z91048 Other nonmedicinal substance allergy status: Secondary | ICD-10-CM | POA: Diagnosis not present

## 2019-03-23 DIAGNOSIS — E860 Dehydration: Secondary | ICD-10-CM | POA: Diagnosis present

## 2019-03-23 DIAGNOSIS — M779 Enthesopathy, unspecified: Secondary | ICD-10-CM | POA: Diagnosis present

## 2019-03-23 DIAGNOSIS — Z66 Do not resuscitate: Secondary | ICD-10-CM | POA: Diagnosis present

## 2019-03-23 DIAGNOSIS — Z79890 Hormone replacement therapy (postmenopausal): Secondary | ICD-10-CM | POA: Diagnosis not present

## 2019-03-23 DIAGNOSIS — Z7982 Long term (current) use of aspirin: Secondary | ICD-10-CM | POA: Diagnosis not present

## 2019-03-23 DIAGNOSIS — F419 Anxiety disorder, unspecified: Secondary | ICD-10-CM | POA: Diagnosis present

## 2019-03-23 DIAGNOSIS — I10 Essential (primary) hypertension: Secondary | ICD-10-CM | POA: Diagnosis present

## 2019-03-23 DIAGNOSIS — R627 Adult failure to thrive: Secondary | ICD-10-CM | POA: Diagnosis present

## 2019-03-23 DIAGNOSIS — N3000 Acute cystitis without hematuria: Secondary | ICD-10-CM | POA: Diagnosis present

## 2019-03-23 DIAGNOSIS — F320 Major depressive disorder, single episode, mild: Secondary | ICD-10-CM | POA: Diagnosis not present

## 2019-03-23 DIAGNOSIS — B961 Klebsiella pneumoniae [K. pneumoniae] as the cause of diseases classified elsewhere: Secondary | ICD-10-CM | POA: Diagnosis present

## 2019-03-23 DIAGNOSIS — Z85828 Personal history of other malignant neoplasm of skin: Secondary | ICD-10-CM | POA: Diagnosis not present

## 2019-03-23 DIAGNOSIS — E039 Hypothyroidism, unspecified: Secondary | ICD-10-CM | POA: Diagnosis present

## 2019-03-23 DIAGNOSIS — J449 Chronic obstructive pulmonary disease, unspecified: Secondary | ICD-10-CM | POA: Diagnosis present

## 2019-03-23 DIAGNOSIS — Z79899 Other long term (current) drug therapy: Secondary | ICD-10-CM | POA: Diagnosis not present

## 2019-03-23 DIAGNOSIS — Z91041 Radiographic dye allergy status: Secondary | ICD-10-CM | POA: Diagnosis not present

## 2019-03-23 DIAGNOSIS — N179 Acute kidney failure, unspecified: Secondary | ICD-10-CM | POA: Diagnosis present

## 2019-03-23 DIAGNOSIS — Z1159 Encounter for screening for other viral diseases: Secondary | ICD-10-CM | POA: Diagnosis not present

## 2019-03-23 DIAGNOSIS — F329 Major depressive disorder, single episode, unspecified: Secondary | ICD-10-CM | POA: Diagnosis present

## 2019-03-23 DIAGNOSIS — N390 Urinary tract infection, site not specified: Secondary | ICD-10-CM | POA: Diagnosis present

## 2019-03-23 LAB — BASIC METABOLIC PANEL WITH GFR
Anion gap: 10 (ref 5–15)
BUN: 17 mg/dL (ref 8–23)
CO2: 20 mmol/L — ABNORMAL LOW (ref 22–32)
Calcium: 8.5 mg/dL — ABNORMAL LOW (ref 8.9–10.3)
Chloride: 101 mmol/L (ref 98–111)
Creatinine, Ser: 0.76 mg/dL (ref 0.44–1.00)
GFR calc Af Amer: >60 mL/min
GFR calc non Af Amer: >60 mL/min
Glucose, Bld: 115 mg/dL — ABNORMAL HIGH (ref 70–99)
Potassium: 3.5 mmol/L (ref 3.5–5.1)
Sodium: 131 mmol/L — ABNORMAL LOW (ref 135–145)

## 2019-03-23 MED ORDER — SODIUM CHLORIDE 0.9 % IV SOLN
1.0000 g | Freq: Once | INTRAVENOUS | Status: AC
  Administered 2019-03-23: 1 g via INTRAVENOUS
  Filled 2019-03-23: qty 1

## 2019-03-23 MED ORDER — SODIUM CHLORIDE 0.9 % IV SOLN
1.0000 g | INTRAVENOUS | Status: DC
  Administered 2019-03-24 – 2019-03-25 (×2): 1 g via INTRAVENOUS
  Filled 2019-03-23 (×2): qty 1
  Filled 2019-03-23 (×2): qty 10

## 2019-03-23 NOTE — Progress Notes (Signed)
PROGRESS NOTE    Sarah Acevedo  SWN:[id number] DOB: [birth year] DOA: 03/22/2019 PCP: Jonathon Jordan, MD    Brief Narrative:  Sarah Acevedo is a 85 y.o. female with medical history significant of hypertension, hypothyroidism, major depression, lid lag right eye.  Patient has had increasing weakness and has difficulty ambulating, which is a change. Prior to admission to skilled living 2 weeks ago patient had been independent in ADLs with supervision and moderate assistance.   In the emergency room patient was found with mild renal insufficiency, no focal deficits.  Some clinical dehydration.  Urine was abnormal.  Assessment & Plan:   Active Problems:   AKI (acute kidney injury) ([place])   Hypothyroidism   Failure to thrive in adult   Lipid disorder   Essential hypertension   Depression   UTI (urinary tract infection)  Failure to thrive/profound physical debility: With multiple ongoing medical problems.  Continue to work with PT OT.  Nutrition consult.  Continue IV fluids today.  Acute UTI present on admission: Continue Rocephin pending final cultures.  Hypothyroidism: Euthyroid on current regimen.  Depression: On risperidone.  Hypertension: Blood pressure well controlled.  Call placed and discussed with patient's daughter, Ms. Jenny Reichmann.  Apparently, patient is recently with worsening ambulatory status, needing more and more help.  She will work with PT OT.  Patient may need higher level of care than assisted living facility. We discussed that with her profound debility now she will work with PT OT and will refer to skilled nursing rehab. Depends upon her outcome, she may need to go to long-term nursing home or assisted living place.  Patient has severe systemic problems.  She is on IV antibiotics and IV fluids.  Anticipating that she will stay more than 2 midnights in the hospital.  DVT prophylaxis: Lovenox Code Status: DNR Family Communication: Daughter, Jenny Reichmann. Disposition Plan:  Skilled nursing facility in next 2 to 3 days.   Consultants:   None  Procedures:   None  Antimicrobials:   Rocephin, 03/22/2019---   Subjective: Patient was seen and examined.  Poor historian.  She had not been eating her breakfast.  She says she has no appetite.  Denies any headache nausea vomiting.  Objective: Vitals:   03/22/19 1645 03/22/19 2000 03/22/19 2200 03/23/19 0549  BP: 137/84 (!) 155/69  (!) 163/96  Pulse: 62 (!) 59  76  Resp: 14 16  17   Temp:  99.4 F (37.4 C)  98.2 F (36.8 C)  TempSrc:  Oral  Oral  SpO2: 99% 99%  99%  Weight:   47.1 kg   Height:   5\' 2"  (1.575 m)     Intake/Output Summary (Last 24 hours) at 03/23/2019 1125 Last data filed at 03/23/2019 0300 Gross per 24 hour  Intake 1235.87 ml  Output 300 ml  Net 935.87 ml   Filed Weights   03/22/19 2200  Weight: 47.1 kg    Examination:  General exam: Appears calm and comfortable, chronically sick looking.  Thin and frail looking. Respiratory system: Clear to auscultation. Respiratory effort normal. Cardiovascular system: S1 & S2 heard, RRR. No JVD, murmurs, rubs, gallops or clicks. No pedal edema. Gastrointestinal system: Abdomen is nondistended, soft and nontender. No organomegaly or masses felt. Normal bowel sounds heard. Central nervous system: Alert and oriented. No focal neurological deficits. Extremities: Symmetric 5 x 5 power. Skin: No rashes, lesions or ulcers Psychiatry: Judgement and insight appear normal. Mood & affect flat.    Data Reviewed: I have personally reviewed following labs  and imaging studies  CBC: Recent Labs  Lab 03/22/19 1224 03/22/19 1910  WBC 7.8 6.2  NEUTROABS 4.9  --   HGB 13.7 13.5  HCT 40.9 40.8  MCV 87.0 88.1  PLT 311 710   Basic Metabolic Panel: Recent Labs  Lab 03/22/19 1224 03/22/19 1910 03/23/19 0220  NA 131*  --  131*  K 3.9  --  3.5  CL 98  --  101  CO2 22  --  20*  GLUCOSE 115*  --  115*  BUN 24*  --  17  CREATININE 1.02* 0.94  0.76  CALCIUM 9.1  --  8.5*   GFR: Estimated Creatinine Clearance: 39.6 mL/min (by C-G formula based on SCr of 0.76 mg/dL). Liver Function Tests: No results for input(s): AST, ALT, ALKPHOS, BILITOT, PROT, ALBUMIN in the last 168 hours. No results for input(s): LIPASE, AMYLASE in the last 168 hours. No results for input(s): AMMONIA in the last 168 hours. Coagulation Profile: No results for input(s): INR, PROTIME in the last 168 hours. Cardiac Enzymes: No results for input(s): CKTOTAL, CKMB, CKMBINDEX, TROPONINI in the last 168 hours. BNP (last 3 results) No results for input(s): PROBNP in the last 8760 hours. HbA1C: No results for input(s): HGBA1C in the last 72 hours. CBG: No results for input(s): GLUCAP in the last 168 hours. Lipid Profile: No results for input(s): CHOL, HDL, LDLCALC, TRIG, CHOLHDL, LDLDIRECT in the last 72 hours. Thyroid Function Tests: No results for input(s): TSH, T4TOTAL, FREET4, T3FREE, THYROIDAB in the last 72 hours. Anemia Panel: No results for input(s): VITAMINB12, FOLATE, FERRITIN, TIBC, IRON, RETICCTPCT in the last 72 hours. Sepsis Labs: No results for input(s): PROCALCITON, LATICACIDVEN in the last 168 hours.  Recent Results (from the past 240 hour(s))  SARS Coronavirus 2 (CEPHEID - Performed in [facility] hospital lab), Hosp Order     Status: None   Collection Time: 03/22/19  7:10 PM   Specimen: Nasopharyngeal Swab  Result Value Ref Range Status   SARS Coronavirus 2 NEGATIVE NEGATIVE Final    Comment: (NOTE) If result is NEGATIVE SARS-CoV-2 target nucleic acids are NOT DETECTED. The SARS-CoV-2 RNA is generally detectable in upper and lower  respiratory specimens during the acute phase of infection. The lowest  concentration of SARS-CoV-2 viral copies this assay can detect is 250  copies / mL. A negative result does not preclude SARS-CoV-2 infection  and should not be used as the sole basis for treatment or other  patient management decisions.   A negative result may occur with  improper specimen collection / handling, submission of specimen other  than nasopharyngeal swab, presence of viral mutation(s) within the  areas targeted by this assay, and inadequate number of viral copies  (<250 copies / mL). A negative result must be combined with clinical  observations, patient history, and epidemiological information. If result is POSITIVE SARS-CoV-2 target nucleic acids are DETECTED. The SARS-CoV-2 RNA is generally detectable in upper and lower  respiratory specimens dur ing the acute phase of infection.  Positive  results are indicative of active infection with SARS-CoV-2.  Clinical  correlation with patient history and other diagnostic information is  necessary to determine patient infection status.  Positive results do  not rule out bacterial infection or co-infection with other viruses. If result is PRESUMPTIVE POSTIVE SARS-CoV-2 nucleic acids MAY BE PRESENT.   A presumptive positive result was obtained on the submitted specimen  and confirmed on repeat testing.  While 2019 novel coronavirus  (SARS-CoV-2) nucleic acids  may be present in the submitted sample  additional confirmatory testing may be necessary for epidemiological  and / or clinical management purposes  to differentiate between  SARS-CoV-2 and other Sarbecovirus currently known to infect humans.  If clinically indicated additional testing with an alternate test  methodology [phone]) is advised. The SARS-CoV-2 RNA is generally  detectable in upper and lower respiratory sp ecimens during the acute  phase of infection. The expected result is Negative. Fact Sheet for Patients:  StrictlyIdeas.no Fact Sheet for Healthcare Providers: BankingDealers.co.za This test is not yet approved or cleared by the [place] FDA and has been authorized for detection and/or diagnosis of SARS-CoV-2 by FDA under an Emergency Use  Authorization (EUA).  This EUA will remain in effect (meaning this test can be used) for the duration of the COVID-19 declaration under Section 564(b)(1) of the Act, 21 U.S.C. section 360bbb-3(b)(1), unless the authorization is terminated or revoked sooner. Performed at [facility], [place] [address], [place], [place] [address]          Radiology Studies: Ct Head Wo Contrast  Result Date: 03/22/2019 CLINICAL DATA:  85 year old nursing home patient presenting with RIGHT eye pain and swelling. There is no drainage from the RIGHT eye. EXAM: CT HEAD AND ORBITS WITHOUT CONTRAST TECHNIQUE: Contiguous axial images were obtained from the base of the skull through the vertex without contrast. Multidetector CT imaging of the orbits was performed using the standard protocol without intravenous contrast. A metallic BB was placed on the right temple in order to reliably differentiate right from left. COMPARISON:  Head CT 07/17/2014. No prior orbital CT. FINDINGS: CT HEAD FINDINGS Brain: Ventricular system normal in size and appearance for age. Mild to moderate age related cortical atrophy, mildly progressive since 2015. Mild changes of small vessel disease of the white matter diffusely, unchanged. No midline shift. No acute hemorrhage or hematoma. No extra-axial fluid collections. No evidence of acute infarction. Approximate 2.6 x 2.8 x 2.3 cm heterogeneous mass with its epicenter in the RIGHT cavernous sinus, partially calcified. No masses elsewhere. Vascular: Severe BILATERAL carotid siphon and mild LEFT vertebral artery atherosclerosis. No hyperdense vessel. Skull: No skull fracture or other focal osseous abnormality involving the skull. Other: None. CT ORBITS FINDINGS Orbits: No orbital fractures. No evidence of orbital hemorrhage or hematoma. No acute inflammatory changes involving the RIGHT orbital fat. Ocular muscles normal in appearance and symmetric bilaterally. Dysconjugate gaze is suspected.  Hyperdense RIGHT lens compared to that on the LEFT. Visualized sinuses: Mucous retention cyst or polyp in the LEFT maxillary sinus. Remaining visualized paranasal sinuses, BILATERAL mastoid air cells and BILATERAL middle ear cavities well-aerated. Soft tissues: Unremarkable. IMPRESSION: 1. No acute intracranial abnormality. 2. Mild to moderate age related cortical atrophy and mild chronic microvascular ischemic changes of the white matter diffusely. 3. 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 4. No orbital fractures identified. 5. Hyperdense RIGHT lens compared to that on the LEFT, query cataract. 6. Mild chronic left maxillary sinus disease. MRI of the brain without and with contrast is recommended in further evaluation to better characterize the mass. Electronically Signed   By: Evangeline Dakin M.D.   On: 03/22/2019 12:40   Ct Orbits Wo Contrast  Result Date: 03/22/2019 CLINICAL DATA:  85 year old nursing home patient presenting with RIGHT eye pain and swelling. There is no drainage from the RIGHT  eye. EXAM: CT HEAD AND ORBITS WITHOUT CONTRAST TECHNIQUE: Contiguous axial images were obtained from the base of the skull through the vertex without contrast. Multidetector CT imaging of the orbits was performed using the standard protocol without intravenous contrast. A metallic BB was placed on the right temple in order to reliably differentiate right from left. COMPARISON:  Head CT 07/17/2014. No prior orbital CT. FINDINGS: CT HEAD FINDINGS Brain: Ventricular system normal in size and appearance for age. Mild to moderate age related cortical atrophy, mildly progressive since 2015. Mild changes of small vessel disease of the white matter diffusely, unchanged. No midline shift. No acute hemorrhage or hematoma. No extra-axial fluid collections. No evidence of acute  infarction. Approximate 2.6 x 2.8 x 2.3 cm heterogeneous mass with its epicenter in the RIGHT cavernous sinus, partially calcified. No masses elsewhere. Vascular: Severe BILATERAL carotid siphon and mild LEFT vertebral artery atherosclerosis. No hyperdense vessel. Skull: No skull fracture or other focal osseous abnormality involving the skull. Other: None. CT ORBITS FINDINGS Orbits: No orbital fractures. No evidence of orbital hemorrhage or hematoma. No acute inflammatory changes involving the RIGHT orbital fat. Ocular muscles normal in appearance and symmetric bilaterally. Dysconjugate gaze is suspected. Hyperdense RIGHT lens compared to that on the LEFT. Visualized sinuses: Mucous retention cyst or polyp in the LEFT maxillary sinus. Remaining visualized paranasal sinuses, BILATERAL mastoid air cells and BILATERAL middle ear cavities well-aerated. Soft tissues: Unremarkable. IMPRESSION: 1. No acute intracranial abnormality. 2. Mild to moderate age related cortical atrophy and mild chronic microvascular ischemic changes of the white matter diffusely. 3. 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 4. No orbital fractures identified. 5. Hyperdense RIGHT lens compared to that on the LEFT, query cataract. 6. Mild chronic left maxillary sinus disease. MRI of the brain without and with contrast is recommended in further evaluation to better characterize the mass. Electronically Signed   By: Evangeline Dakin M.D.   On: 03/22/2019 12:40        Scheduled Meds:  aspirin  325 mg Oral Daily   atorvastatin  40 mg Oral QHS   citalopram  20 mg Oral Daily   cycloSPORINE  1 drop Both Eyes BID   enoxaparin (LOVENOX) injection  40 mg Subcutaneous Daily   ferrous sulfate  325 mg Oral BID WC   levothyroxine  50 mcg Oral Daily   Melatonin  1 tablet Oral QHS   metoprolol  tartrate  100 mg Oral Daily   pantoprazole  40 mg Oral Daily   risperiDONE  0.5 mg Oral QHS   senna  1 tablet Oral BID   Continuous Infusions:  [START ON 03/24/2019] cefTRIAXone (ROCEPHIN)  IV     dextrose 5 % and 0.45% NaCl 50 mL/hr at 03/23/19 0300     LOS: 0 days    Time spent: 25 minutes    Barb Merino, MD [facility] Pager [phone]  If 7PM-7AM, please contact night-coverage [email or web address] Password [facility] 03/23/2019, 11:25 AM

## 2019-03-23 NOTE — Progress Notes (Signed)
Occupational Therapy Treatment/Visit  Returned for second visit to discuss with daughter about dc plan and update on performance with therapy. Upon arrival, pt sitting in recliner and smiling. Pt recalling working with OT earlier and singing. Pt's daughter reports that prior to two weeks ago, pt was performing BADLs and functional mobility safely; pt with significant decline in last two weeks. Pt's daughter reporting she has been to rehab before and had good results. Educating pt and daughter and benefits of familiar environment for cognition. Feel pt would benefit from post-acute rehab to optimize safety and independence with ADLs. Will continue to follow acutely as admitted.      03/23/19 1610  OT Visit Information  Last OT Received On 03/23/19  History of Present Illness Pt is a 85 y.o. 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 Over past 2 weeks, pt with progressive weakness, weight loss, and failure to thrive. She is a resident of [facility]. Admitted with AKI, UTI.    Precautions  Precautions Fall;Other (comment)  Precaution Comments Right eye esotropia and visual deficits  Pain Assessment  Pain Assessment No/denies pain  Cognition  Arousal/Alertness Awake/alert  Behavior During Therapy Flat affect;Impulsive  Overall Cognitive Status Impaired/Different from baseline  General Comments Pt happy her daughter is present and could remember singing with OT earlier.  Upper Extremity Assessment  Upper Extremity Assessment Generalized weakness  Lower Extremity Assessment  Lower Extremity Assessment Generalized weakness  General Comments  General comments (skin integrity, edema, etc.) Daughter present and discussed dc plan. [place] daughter on performance with PT and OT today. Pt's daughter reporting she would like pt to go to rehab due to significant decline in function over past two  weeks. Daughter reports pt has been to a rehab and had good experience  OT - End of Session  Activity Tolerance Patient tolerated treatment well  Patient left in chair;with call bell/phone within reach;with nursing/sitter in room  Nurse Communication Mobility status  OT Assessment/Plan  OT Plan Discharge plan remains appropriate  OT Visit Diagnosis Unsteadiness on feet (R26.81);Other abnormalities of gait and mobility (R26.89);Muscle weakness (generalized) (M62.81);Other symptoms and signs involving cognitive function;Pain  OT Frequency (ACUTE ONLY) Min 2X/week  Recommendations for Other Services PT consult  Follow Up Recommendations SNF;Supervision/Assistance - 24 hour  OT Equipment None recommended by OT  AM-PAC OT "6 Clicks" Daily Activity Outcome Measure (Version 2)  Help from another person eating meals? 4  Help from another person taking care of personal grooming? 3  Help from another person toileting, which includes using toliet, bedpan, or urinal? 3  Help from another person bathing (including washing, rinsing, drying)? 3  Help from another person to put on and taking off regular upper body clothing? 3  Help from another person to put on and taking off regular lower body clothing? 3  6 Click Score 19  OT Goal Progression  Progress towards OT goals Progressing toward goals  Acute Rehab OT Goals  Patient Stated Goal Unstated  OT Goal Formulation With patient  Time For Goal Achievement 04/06/19  Potential to Achieve Goals Good  ADL Goals  Pt Will Perform Grooming with modified independence;standing  Pt Will Perform Upper Body Dressing with modified independence;sitting  Pt Will Perform Lower Body Dressing with modified independence;sit to/from stand  Pt Will Transfer to Toilet with modified independence;ambulating;regular height toilet  Pt Will Perform Toileting - Clothing Manipulation and hygiene with modified independence;sitting/lateral  leans;sit to/from stand  OT Time  Calculation  OT Start Time (ACUTE ONLY) 1627  OT Stop Time (ACUTE ONLY) 1636  OT Time Calculation (min) 9 min  OT General Charges  $OT Visit 1 Visit    [place], OTR/L Acute Rehab Pager: [phone] Office: [phone]

## 2019-03-23 NOTE — Progress Notes (Signed)
Pt pulled her IV line again for the 2nd time, alert but confused she ate her breakfast and lunch ,will inform IV team to reinsert a new IV line.

## 2019-03-23 NOTE — Evaluation (Addendum)
Physical Therapy Evaluation Patient Details Name: Sarah Acevedo MRN: [id number] DOB: Feb 02, 1933 Today's Date: 03/23/2019   History of Present Illness  Pt is a 85 y.o. 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 Over past 2 weeks, pt with progressive weakness, weight loss, and failure to thrive. She is a resident of [facility]. Admitted with AKI, UTI.    Clinical Impression  Pt admitted with above diagnosis. Pt currently with functional limitations due to the deficits listed below (see PT Problem List). Prior to admission, pt has been living at [facility] for the past 2 weeks and requires assist for ADL's. On PT evaluation, pt performing functional mobility at a min assist level. Received in bed with bowel incontinence, walked to bathroom and totalA required for peri care. Ambulating ~25 feet with walker. Pt presenting with generalized weakness, balance impairments, visual deficits (particularly right eye), and decreased cognition. Pt reporting decreased appetite and general malaise, encouraged to eat with set up assist provided. Pt will benefit from skilled PT to increase their independence and safety with mobility to allow discharge to the venue listed below.       Follow Up Recommendations SNF;Supervision/Assistance - 24 hour    Equipment Recommendations  None recommended by PT    Recommendations for Other Services       Precautions / Restrictions Precautions Precautions: Fall;Other (comment) Precaution Comments: Right visual deficit Restrictions Weight Bearing Restrictions: No      Mobility  Bed Mobility Overal bed mobility: Needs Assistance Bed Mobility: Supine to Sit;Sit to Supine     Supine to sit: Min guard Sit to supine: Min assist   General bed mobility comments: Min assist to return to bed  Transfers Overall transfer level: Needs  assistance Equipment used: Rolling walker (2 wheeled) Transfers: Sit to/from Stand Sit to Stand: Min assist         General transfer comment: MinA to rise from bed and toilet, preferring to pull up on walker  Ambulation/Gait Ambulation/Gait assistance: Min assist Gait Distance (Feet): 25 Feet Assistive device: Rolling walker (2 wheeled) Gait Pattern/deviations: Step-through pattern;Decreased stride length Gait velocity: decreased   General Gait Details: MinA for walker negotiation, verbal cues for direction, pt running into objects without assist. No overt LOB   Stairs            Wheelchair Mobility    Modified Rankin (Stroke Patients Only)       Balance Overall balance assessment: Needs assistance Sitting-balance support: Feet supported Sitting balance-Leahy Scale: Fair     Standing balance support: Bilateral upper extremity supported Standing balance-Leahy Scale: Poor Standing balance comment: reliant on external support                             Pertinent Vitals/Pain Pain Assessment: No/denies pain    Home Living Family/patient expects to be discharged to:: Assisted living                 Additional Comments: Resident of [facility], daughter lives in [place]    Prior Function Level of Independence: Needs assistance   Gait / Transfers Assistance Needed: using walker ?   ADL's / Homemaking Assistance Needed: likely needs assist with all ADL's  Comments: Pt unable to provide     Hand Dominance        Extremity/Trunk Assessment   Upper Extremity Assessment Upper Extremity Assessment:  Generalized weakness    Lower Extremity Assessment Lower Extremity Assessment: Generalized weakness       Communication   Communication: No difficulties  Cognition Arousal/Alertness: Awake/alert Behavior During Therapy: Flat affect;Impulsive Overall Cognitive Status: Impaired/Different from baseline Area of Impairment:  Orientation;Attention;Following commands;Awareness;Safety/judgement                 Orientation Level: Disoriented to;Place;Time;Situation Current Attention Level: Focused   Following Commands: Follows one step commands inconsistently Safety/Judgement: Decreased awareness of safety Awareness: Intellectual   General Comments: Pt mostly staring vacantly, flat affect, responding to questions with increased time and repetition. When asked where she was, pt states, "I'm at your house." Unable to state where she lives. Also impulsive, standing up before set up.       General Comments      Exercises     Assessment/Plan    PT Assessment Patient needs continued PT services  PT Problem List Decreased strength;Decreased activity tolerance;Decreased balance;Decreased mobility;Decreased cognition;Decreased safety awareness       PT Treatment Interventions Gait training;DME instruction;Functional mobility training;Therapeutic activities;Therapeutic exercise;Balance training;Patient/family education    PT Goals (Current goals can be found in the Care Plan section)  Acute Rehab PT Goals Patient Stated Goal: unable PT Goal Formulation: Patient unable to participate in goal setting Time For Goal Achievement: 04/06/19 Potential to Achieve Goals: Fair    Frequency Min 2X/week   Barriers to discharge        Co-evaluation               AM-PAC PT "6 Clicks" Mobility  Outcome Measure Help needed turning from your back to your side while in a flat bed without using bedrails?: None Help needed moving from lying on your back to sitting on the side of a flat bed without using bedrails?: A Sarah Help needed moving to and from a bed to a chair (including a wheelchair)?: A Sarah Help needed standing up from a chair using your arms (e.g., wheelchair or bedside chair)?: A Sarah Help needed to walk in hospital room?: A Sarah Help needed climbing 3-5 steps with a railing? : A Lot 6  Click Score: 18    End of Session Equipment Utilized During Treatment: Gait belt Activity Tolerance: Patient tolerated treatment well Patient left: in bed;with call bell/phone within reach;with bed alarm set;with nursing/sitter in room Nurse Communication: Mobility status PT Visit Diagnosis: Unsteadiness on feet (R26.81);Muscle weakness (generalized) (M62.81);Difficulty in walking, not elsewhere classified (R26.2);Adult, failure to thrive (R62.7)    Time: 0233-4356 PT Time Calculation (min) (ACUTE ONLY): 22 min   Charges:   PT Evaluation $PT Eval Moderate Complexity: 1 Mod          Ellamae Sia, [place], DPT Acute Rehabilitation Services Pager [phone] Office [phone]   Willy Eddy 03/23/2019, 11:07 AM

## 2019-03-23 NOTE — Evaluation (Signed)
Occupational Therapy Evaluation Patient Details Name: Sarah Acevedo MRN: [id number] DOB: 03/09/1933 Today's Date: 03/23/2019    History of Present Illness Pt is a 85 y.o. 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 Over past 2 weeks, pt with progressive weakness, weight loss, and failure to thrive. She is a resident of [facility]. Admitted with AKI, UTI.     Clinical Impression    PTA, pt was living with at [facility] and reports she performed BADLs and used RW for functional mobility; due to poor orientation and confusion, pt poor historian to provide information. Currently, pt requires Min A for LB ADLs, toileting, and functional mobility using RW. Pt demonstrating poor cognition and balance impacting her safety during ADLs. Pt would benefit from further acute OT to facilitate safe dc. If pt able to receive 24/7 support at ALF, she may be able to return to ALF with follow up [place]. However if 24/7 is not available, recommend dc to SNF for further OT to optimize safety, independence with ADLs, and return to PLOF.      Follow Up Recommendations  SNF;Supervision/Assistance - 24 hour;Home health OT    Equipment Recommendations  None recommended by OT    Recommendations for Other Services PT consult     Precautions / Restrictions Precautions Precautions: Fall;Other (comment) Precaution Comments: Right eye esotropia and visual deficits Restrictions Weight Bearing Restrictions: No      Mobility Bed Mobility Overal bed mobility: Needs Assistance Bed Mobility: Supine to Sit;Sit to Supine     Supine to sit: Min guard Sit to supine: Min assist   General bed mobility comments: Min assist to return to bed  Transfers Overall transfer level: Needs assistance Equipment used: Rolling walker (2 wheeled) Transfers: Sit to/from Stand Sit to Stand: Min assist         General  transfer comment: MinA to rise from bed and toilet, preferring to pull up on walker    Balance Overall balance assessment: Needs assistance Sitting-balance support: Feet supported Sitting balance-Leahy Scale: Fair     Standing balance support: Bilateral upper extremity supported Standing balance-Leahy Scale: Fair Standing balance comment: Able to maintain static standing at sink without UE support                           ADL either performed or assessed with clinical judgement   ADL Overall ADL's : Needs assistance/impaired Eating/Feeding: Set up;Supervision/ safety;Sitting   Grooming: Wash/dry hands;Min guard;Standing Grooming Details (indicate cue type and reason): Min Guard A at sink for safety in standing Upper Body Bathing: Min guard;Sitting   Lower Body Bathing: Minimal assistance;Sit to/from stand   Upper Body Dressing : Min guard;Sitting   Lower Body Dressing: Minimal assistance;Sit to/from stand   Toilet Transfer: Minimal assistance;Ambulation;Regular Toilet;Grab bars;RW Armed forces technical officer Details (indicate cue type and reason): Min A for power up into standing Toileting- Clothing Manipulation and Hygiene: Minimal assistance;Sitting/lateral lean;Sit to/from stand Toileting - Clothing Manipulation Details (indicate cue type and reason): Min A for balance and to make sure peri area clean     Functional mobility during ADLs: Min guard;Rolling walker General ADL Comments: Pt demonstrating decreased balance and cognition     Vision Patient Visual Report: Other (comment)(baseline vision deficits)       Perception     Praxis      Pertinent Vitals/Pain Pain Assessment: No/denies pain  Hand Dominance Right   Extremity/Trunk Assessment Upper Extremity Assessment Upper Extremity Assessment: Generalized weakness   Lower Extremity Assessment Lower Extremity Assessment: Generalized weakness   Cervical / Trunk Assessment Cervical / Trunk Assessment:  Kyphotic   Communication Communication Communication: No difficulties   Cognition Arousal/Alertness: Awake/alert Behavior During Therapy: Flat affect;Impulsive Overall Cognitive Status: Impaired/Different from baseline Area of Impairment: Orientation;Attention;Following commands;Awareness;Safety/judgement                 Orientation Level: Disoriented to;Place;Time;Situation Current Attention Level: Focused   Following Commands: Follows one step commands inconsistently Safety/Judgement: Decreased awareness of safety Awareness: Intellectual   General Comments: Pt mostly staring vacantly, flat affect, responding to questions with increased time and repetition. When asked where she was, pt states, "I'm at your house." Unable to state where she lives. Also impulsive, standing up before set up.    General Comments  Pt reporting she enjoys hymns; played and sang during session    Exercises     Shoulder Instructions      Home Living Family/patient expects to be discharged to:: Assisted living                                 Additional Comments: Resident of [facility], daughter lives in [place]      Prior Functioning/Environment Level of Independence: Needs assistance  Gait / Transfers Assistance Needed: Pt reports she uses a walker ADL's / Homemaking Assistance Needed: Unsure due to poor historian   Comments: Pt unable to provide        OT Problem List: Decreased strength;Decreased range of motion;Decreased activity tolerance;Impaired balance (sitting and/or standing);Decreased safety awareness;Decreased knowledge of use of DME or AE;Decreased knowledge of precautions;Decreased cognition      OT Treatment/Interventions: Self-care/ADL training;Therapeutic exercise;Energy conservation;DME and/or AE instruction;Therapeutic activities;Patient/family education    OT Goals(Current goals can be found in the care plan section) Acute Rehab OT Goals Patient  Stated Goal: Unstated OT Goal Formulation: With patient Time For Goal Achievement: 04/06/19 Potential to Achieve Goals: Good  OT Frequency: Min 2X/week   Barriers to D/C:            Co-evaluation              AM-PAC OT "6 Clicks" Daily Activity     Outcome Measure Help from another person eating meals?: None Help from another person taking care of personal grooming?: A Little Help from another person toileting, which includes using toliet, bedpan, or urinal?: A Little Help from another person bathing (including washing, rinsing, drying)?: A Little Help from another person to put on and taking off regular upper body clothing?: A Little Help from another person to put on and taking off regular lower body clothing?: A Little 6 Click Score: 19   End of Session Equipment Utilized During Treatment: Rolling walker Nurse Communication: Mobility status  Activity Tolerance: Patient tolerated treatment well Patient left: in chair;with call bell/phone within reach;with chair alarm set  OT Visit Diagnosis: Unsteadiness on feet (R26.81);Other abnormalities of gait and mobility (R26.89);Muscle weakness (generalized) (M62.81);Other symptoms and signs involving cognitive function;Pain                Time: 1350-1414 OT Time Calculation (min): 24 min Charges:  OT General Charges $OT Visit: 1 Visit OT Evaluation $OT Eval Moderate Complexity: 1 Mod OT Treatments $Self Care/Home Management : 8-22 mins  [place], OTR/L Acute Rehab Pager: [phone] Office: [phone]  [place] 03/23/2019, 4:06 PM

## 2019-03-23 NOTE — Plan of Care (Signed)

## 2019-03-24 DIAGNOSIS — F320 Major depressive disorder, single episode, mild: Secondary | ICD-10-CM

## 2019-03-24 DIAGNOSIS — N179 Acute kidney failure, unspecified: Secondary | ICD-10-CM

## 2019-03-24 DIAGNOSIS — N3000 Acute cystitis without hematuria: Principal | ICD-10-CM

## 2019-03-24 NOTE — TOC Initial Note (Signed)
Transition of Care [facility]) - Initial/Assessment Note    Patient Details  Name: Sarah Acevedo MRN: [id number] Date of Birth: 03/09/1933  Transition of Care [facility]) CM/SW Contact:    Alexander [place], [place] Phone Number: 03/24/2019, 10:49 AM  Clinical Narrative:                 CSW spoke with pt daughter Jenny Reichmann on telephone. CSW introduced self, role, reason for call.   Pt has been at [facility] for the past two weeks prior to admission. Prior to that she lived in [place] with her son and in her daughters house in [place]. Due to current pandemic pt had to be in quarantine at [place]. Pt daughter states pt was much different after quarantine which she attributes to lack of stimulation etc. Pt prior to admission to [facility] was ambulating with no assistive devices and now requires more assistance. Pt daughter thinks pt would benefit from SNF placement prior to return to [place].   Pt daughter familiar with SNF process since pt has been to one in [place] about 8 months ago, but would like any offers left at bedside and emailed to [email or web address] whenever they are available.   Continue to follow.  Expected Discharge Plan: Skilled Nursing Facility Barriers to Discharge: Continued Medical Work up, [place] (PASRR)   Patient Goals and CMS Choice Patient states their goals for this hospitalization and ongoing recovery are:: "for her to get the level of care she needs right now"- pt daughter CMS Medicare.gov Compare Post Acute Care list provided to:: Patient Represenative (must comment)(pt daughter) Choice offered to / list presented to : Adult Children  Expected Discharge Plan and Services Expected Discharge Plan: [place] In-house Referral: Clinical Social Work Discharge Planning Services: CM Consult Post Acute Care Choice: [place] Living arrangements for the past 2 months: [place], [place]  Prior Living  Arrangements/Services Living arrangements for the past 2 months: [place], [place] Lives with:: Facility Resident Patient language and need for interpreter reviewed:: Yes(no needs) Do you feel safe going back to the place where you live?: Yes      Need for Family Participation in Patient Care: Yes (Comment)(decision making) Care giver support system in place?: Yes (comment)(adult children)   Criminal Activity/Legal Involvement Pertinent to Current Situation/Hospitalization: No - Comment as needed  Activities of Daily Living Home Assistive Devices/Equipment: Environmental consultant (specify type) ADL Screening (condition at time of admission) Patient's cognitive ability adequate to safely complete daily activities?: Yes Is the patient deaf or have difficulty hearing?: No Does the patient have difficulty seeing, even when wearing glasses/contacts?: No Does the patient have difficulty concentrating, remembering, or making decisions?: No Patient able to express need for assistance with ADLs?: Yes Does the patient have difficulty dressing or bathing?: No Independently performs ADLs?: Yes (appropriate for developmental age) Does the patient have difficulty walking or climbing stairs?: No Weakness of Legs: Both Weakness of Arms/Hands: None  Permission Sought/Granted Permission sought to share information with : Facility Sport and exercise psychologist, Family Supports Permission granted to share information with : No(pt disoriented)  Share Information with NAME: Milus Mallick  Permission granted to share info w AGENCY: SNFs  Permission granted to share info w Relationship: daughter  Permission granted to share info w Contact Information: [phone]  Emotional Assessment Appearance:: Other (Comment Required(spoke with daughter on phone) Attitude/Demeanor/Rapport: (spoke with daughter on phone) Affect (typically observed): (spoke with daughter on phone) Orientation: : Oriented to  Self,  Oriented to Situation, Fluctuating Orientation (Suspected and/or reported Sundowners) Alcohol / Substance Use: Not Applicable Psych Involvement: No (comment)  Admission diagnosis:  Confusion [R41.0] Carotid artery aneurysm (HCC) [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 UTI (urinary tract infection) 03/23/2019  . AKI (acute kidney injury) ([place]) 03/22/2019  . Hypothyroidism 03/22/2019  . Failure to thrive in adult 03/22/2019  . 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 Essential hypertension 03/22/2019  . Depression 03/22/2019  . Brief psychotic disorder ([place])   . 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

## 2019-03-24 NOTE — Progress Notes (Signed)
RN already started PIV no longer needs IV team

## 2019-03-24 NOTE — TOC Progression Note (Signed)
Transition of Care [facility]) - Progression Note    Patient Details  Name: Sarah Acevedo MRN: [id number] Date of Birth: 03/09/1933  Transition of Care [facility]) CM/SW [place], [place] Phone Number: 03/24/2019, 3:18 PM  Clinical Narrative:    Pt PASRR has gone to [facility] review. Pt two offers: [facility] and [facility] send via email as requested to pt daughter [name]'s email.   Continue to follow.    Expected Discharge Plan: [place] Barriers to Discharge: Continued Medical Work up, Environmental education officer)  Expected Discharge Plan and Services Expected Discharge Plan: [place] In-house Referral: Clinical Social Work Discharge Planning Services: CM Consult Post Acute Care Choice: [place] arrangements for the past 2 months: [place], [place]   Social Determinants of Health (SDOH) Interventions    Readmission Risk Interventions No flowsheet data found.

## 2019-03-24 NOTE — Progress Notes (Signed)
[facility] PROGRESS NOTE  Sarah Acevedo NKN:[id number] DOB: 03/09/1933 DOA: 03/22/2019 PCP: Jonathon Jordan, MD  Assessment/Plan:  Acute UTI present on admission: she is afebrile and non-toxic appearing. Urine cultures with greater than 1000,000 colonies gm neg rods.  Provided with  Rocephin. -continue rocephin -monitor  Failure to thrive/profound physical debility: Multiple ongoing medical problems in setting of acute UTI. Evaluated by PT/OT who recommend facility. Patient and daughter agree.  -gentle IV fluids -monitor intake and output -SW for placement  Hypothyroidism: Euthyroid on current regimen.  Depression: somewhat flat during exam/winter. Daughter indicates worse of late.  Home meds include xanax, risperdal.  -continue -holding Xanax -monitor  Hypertension:  Fair control. Home meds include BB -continue BB    Code Status: dnr Family Communication: left message for daughter Disposition Plan: snf    Consultants:    Procedures:    Antibiotics:  Rocephin day #2.   HPI/Subjective: Sitting in chair. Complains breakfast not correct order and I "ice cold". Denies pain/nausea/discomfort  Objective: Vitals:   03/23/19 2146 03/24/19 0543  BP: (!) 146/66 (!) 146/74  Pulse: (!) 58 (!) 57  Resp: 17 16  Temp: 97.8 F (36.6 C) 97.9 F (36.6 C)  SpO2: 98% 99%    Intake/Output Summary (Last 24 hours) at 03/24/2019 1128 Last data filed at 03/24/2019 0900 Gross per 24 hour  Intake 1097.7 ml  Output -  Net 1097.7 ml   Filed Weights   03/22/19 2200  Weight: 47.1 kg    Exam:   General:  Thin frail somewhat pale  Cardiovascular: rrr no mgr no LE edema  Respiratory: normal effort BS clear bilaterally no wheeze  Abdomen: non-distended +BS no guarding or rebounding  Musculoskeletal: joints without swelling/erythema   Data Reviewed: Basic Metabolic Panel: Recent Labs  Lab 03/22/19 1224 03/22/19 1910 03/23/19 0220  NA 131*  --  131*  K  3.9  --  3.5  CL 98  --  101  CO2 22  --  20*  GLUCOSE 115*  --  115*  BUN 24*  --  17  CREATININE 1.02* 0.94 0.76  CALCIUM 9.1  --  8.5*   Liver Function Tests: No results for input(s): AST, ALT, ALKPHOS, BILITOT, PROT, ALBUMIN in the last 168 hours. No results for input(s): LIPASE, AMYLASE in the last 168 hours. No results for input(s): AMMONIA in the last 168 hours. CBC: Recent Labs  Lab 03/22/19 1224 03/22/19 1910  WBC 7.8 6.2  NEUTROABS 4.9  --   HGB 13.7 13.5  HCT 40.9 40.8  MCV 87.0 88.1  PLT 311 271   Cardiac Enzymes: No results for input(s): CKTOTAL, CKMB, CKMBINDEX, TROPONINI in the last 168 hours. BNP (last 3 results) No results for input(s): BNP in the last 8760 hours.  ProBNP (last 3 results) No results for input(s): PROBNP in the last 8760 hours.  CBG: No results for input(s): GLUCAP in the last 168 hours.  Recent Results (from the past 240 hour(s))  Urine culture     Status: Abnormal (Preliminary result)   Collection Time: 03/22/19  2:15 PM   Specimen: Urine, Random  Result Value Ref Range Status   Specimen Description URINE, RANDOM  Final   Special Requests   Final    NONE Performed at [facility], [address] [place], [place] [address]    Culture >=100,000 COLONIES/mL GRAM NEGATIVE RODS (A)  Final   Report Status PENDING  Incomplete  SARS Coronavirus 2 (CEPHEID - Performed in [facility] hospital lab), Hosp Order     Status: None   Collection Time: 03/22/19  7:10 PM   Specimen: Nasopharyngeal Swab  Result Value Ref Range Status   SARS Coronavirus 2 NEGATIVE NEGATIVE Final    Comment: (NOTE) If result is NEGATIVE SARS-CoV-2 target nucleic acids are NOT DETECTED. The SARS-CoV-2 RNA is generally detectable in upper and lower  respiratory specimens during the acute phase of infection. The lowest  concentration of SARS-CoV-2 viral copies this assay can detect is 250  copies / mL. A negative result does not preclude SARS-CoV-2 infection   and should not be used as the sole basis for treatment or other  patient management decisions.  A negative result may occur with  improper specimen collection / handling, submission of specimen other  than nasopharyngeal swab, presence of viral mutation(s) within the  areas targeted by this assay, and inadequate number of viral copies  (<250 copies / mL). A negative result must be combined with clinical  observations, patient history, and epidemiological information. If result is POSITIVE SARS-CoV-2 target nucleic acids are DETECTED. The SARS-CoV-2 RNA is generally detectable in upper and lower  respiratory specimens dur ing the acute phase of infection.  Positive  results are indicative of active infection with SARS-CoV-2.  Clinical  correlation with patient history and other diagnostic information is  necessary to determine patient infection status.  Positive results do  not rule out bacterial infection or co-infection with other viruses. If result is PRESUMPTIVE POSTIVE SARS-CoV-2 nucleic acids MAY BE PRESENT.   A presumptive positive result was obtained on the submitted specimen  and confirmed on repeat testing.  While 2019 novel coronavirus  (SARS-CoV-2) nucleic acids may be present in the submitted sample  additional confirmatory testing may be necessary for epidemiological  and / or clinical management purposes  to differentiate between  SARS-CoV-2 and other Sarbecovirus currently known to infect humans.  If clinically indicated additional testing with an alternate test  methodology [phone]) is advised. The SARS-CoV-2 RNA is generally  detectable in upper and lower respiratory sp ecimens during the acute  phase of infection. The expected result is Negative. Fact Sheet for Patients:  StrictlyIdeas.no Fact Sheet for Healthcare Providers: BankingDealers.co.za This test is not yet approved or cleared by the [place] FDA  and has been authorized for detection and/or diagnosis of SARS-CoV-2 by FDA under an Emergency Use Authorization (EUA).  This EUA will remain in effect (meaning this test can be used) for the duration of the COVID-19 declaration under Section 564(b)(1) of the Act, 21 U.S.C. section 360bbb-3(b)(1), unless the authorization is terminated or revoked sooner. Performed at [facility], [place] [address], [place], [place] [address]      Studies: Ct Head Wo Contrast  Result Date: 03/22/2019 CLINICAL DATA:  85 year old nursing home patient presenting with RIGHT eye pain and swelling. There is no drainage from the RIGHT eye. EXAM: CT HEAD AND ORBITS WITHOUT CONTRAST TECHNIQUE: Contiguous axial images were obtained from the base of the skull through the vertex without contrast. Multidetector CT imaging of the orbits was performed using the standard protocol without intravenous contrast. A metallic BB was placed on the right temple in order to reliably differentiate right from left. COMPARISON:  Head CT 07/17/2014. No prior orbital CT. FINDINGS: CT HEAD FINDINGS Brain: Ventricular system normal in size and appearance for age. Mild to moderate age related cortical atrophy, mildly progressive since 2015. Mild changes of small vessel disease of the white matter diffusely,  unchanged. No midline shift. No acute hemorrhage or hematoma. No extra-axial fluid collections. No evidence of acute infarction. Approximate 2.6 x 2.8 x 2.3 cm heterogeneous mass with its epicenter in the RIGHT cavernous sinus, partially calcified. No masses elsewhere. Vascular: Severe BILATERAL carotid siphon and mild LEFT vertebral artery atherosclerosis. No hyperdense vessel. Skull: No skull fracture or other focal osseous abnormality involving the skull. Other: None. CT ORBITS FINDINGS Orbits: No orbital fractures. No evidence of orbital hemorrhage or hematoma. No acute inflammatory changes involving the RIGHT orbital fat. Ocular muscles  normal in appearance and symmetric bilaterally. Dysconjugate gaze is suspected. Hyperdense RIGHT lens compared to that on the LEFT. Visualized sinuses: Mucous retention cyst or polyp in the LEFT maxillary sinus. Remaining visualized paranasal sinuses, BILATERAL mastoid air cells and BILATERAL middle ear cavities well-aerated. Soft tissues: Unremarkable. IMPRESSION: 1. No acute intracranial abnormality. 2. Mild to moderate age related cortical atrophy and mild chronic microvascular ischemic changes of the white matter diffusely. 3. 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 4. No orbital fractures identified. 5. Hyperdense RIGHT lens compared to that on the LEFT, query cataract. 6. Mild chronic left maxillary sinus disease. MRI of the brain without and with contrast is recommended in further evaluation to better characterize the mass. Electronically Signed   By: Evangeline Dakin M.D.   On: 03/22/2019 12:40   Ct Orbits Wo Contrast  Result Date: 03/22/2019 CLINICAL DATA:  85 year old nursing home patient presenting with RIGHT eye pain and swelling. There is no drainage from the RIGHT eye. EXAM: CT HEAD AND ORBITS WITHOUT CONTRAST TECHNIQUE: Contiguous axial images were obtained from the base of the skull through the vertex without contrast. Multidetector CT imaging of the orbits was performed using the standard protocol without intravenous contrast. A metallic BB was placed on the right temple in order to reliably differentiate right from left. COMPARISON:  Head CT 07/17/2014. No prior orbital CT. FINDINGS: CT HEAD FINDINGS Brain: Ventricular system normal in size and appearance for age. Mild to moderate age related cortical atrophy, mildly progressive since 2015. Mild changes of small vessel disease of the white matter diffusely, unchanged. No midline shift. No acute  hemorrhage or hematoma. No extra-axial fluid collections. No evidence of acute infarction. Approximate 2.6 x 2.8 x 2.3 cm heterogeneous mass with its epicenter in the RIGHT cavernous sinus, partially calcified. No masses elsewhere. Vascular: Severe BILATERAL carotid siphon and mild LEFT vertebral artery atherosclerosis. No hyperdense vessel. Skull: No skull fracture or other focal osseous abnormality involving the skull. Other: None. CT ORBITS FINDINGS Orbits: No orbital fractures. No evidence of orbital hemorrhage or hematoma. No acute inflammatory changes involving the RIGHT orbital fat. Ocular muscles normal in appearance and symmetric bilaterally. Dysconjugate gaze is suspected. Hyperdense RIGHT lens compared to that on the LEFT. Visualized sinuses: Mucous retention cyst or polyp in the LEFT maxillary sinus. Remaining visualized paranasal sinuses, BILATERAL mastoid air cells and BILATERAL middle ear cavities well-aerated. Soft tissues: Unremarkable. IMPRESSION: 1. No acute intracranial abnormality. 2. Mild to moderate age related cortical atrophy and mild chronic microvascular ischemic changes of the white matter diffusely. 3. 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 4. No orbital fractures identified. 5. Hyperdense RIGHT lens compared to that on the LEFT,  query cataract. 6. Mild chronic left maxillary sinus disease. MRI of the brain without and with contrast is recommended in further evaluation to better characterize the mass. Electronically Signed   By: Evangeline Dakin M.D.   On: 03/22/2019 12:40    Scheduled Meds: . aspirin  325 mg Oral Daily  . atorvastatin  40 mg Oral QHS  . enoxaparin (LOVENOX) injection  40 mg Subcutaneous Daily  . ferrous sulfate  325 mg Oral BID WC  . levothyroxine  50 mcg Oral Q0600  . Melatonin  1 tablet Oral QHS  . metoprolol  tartrate  100 mg Oral Daily  . pantoprazole  40 mg Oral Daily  . risperiDONE  0.5 mg Oral QHS  . senna  1 tablet Oral BID   Continuous Infusions: . cefTRIAXone (ROCEPHIN)  IV      Principal Problem:   UTI (urinary tract infection) Active Problems:   AKI (acute kidney injury) ([place])   Failure to thrive in adult   Essential hypertension   Hypothyroidism   Depression   Lipid disorder    Time spent: 14 minutes    [place] NP  [facility]  If 7PM-7AM, please contact night-coverage at [email or web address], password [facility] 03/24/2019, 11:28 AM  LOS: 1 day

## 2019-03-24 NOTE — NC FL2 (Signed)
[facility] [place] LEVEL OF CARE SCREENING TOOL     IDENTIFICATION  Patient Name: Sarah Acevedo Birthdate: 03/09/1933 Sex: female Admission Date (Current Location): 03/22/2019  [facility] and [place] Number:  Herbalist and Address:  [facility] [place], [place] [address], [place], [place] [address]      Provider Number: [id number]  Attending Physician Name and Address:  Geradine Girt, DO  Relative Name and Phone Number:  Milus Mallick; [id number];[phone]    Current Level of Care: Hospital Recommended Level of Care: [place] Prior Approval Number:    Date Approved/Denied:   PASRR Number: pending  Discharge Plan: SNF    Current Diagnoses: Patient Active Problem List   Diagnosis Date Noted  . UTI (urinary tract infection) 03/23/2019  . AKI (acute kidney injury) ([place]) 03/22/2019  . Hypothyroidism 03/22/2019  . Failure to thrive in adult 03/22/2019  . 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 Essential hypertension 03/22/2019  . Depression 03/22/2019  . Brief psychotic disorder ([place])   . 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    Orientation RESPIRATION BLADDER Height & Weight     Self, Situation  Normal Incontinent Weight: 103 lb 13.4 oz (47.1 kg) Height:  5\' 2"  (157.5 cm)  BEHAVIORAL SYMPTOMS/MOOD NEUROLOGICAL BOWEL NUTRITION STATUS      Continent Diet(see discharge summary)  AMBULATORY STATUS COMMUNICATION OF NEEDS Skin   Extensive Assist Verbally Normal                       Personal Care Assistance Level of Assistance  Bathing, Dressing, Feeding Bathing Assistance: Maximum assistance Feeding assistance: Independent Dressing Assistance: Maximum assistance     Functional Limitations Info  Sight, Hearing, Speech Sight Info: Adequate Hearing Info: Adequate Speech Info: Adequate    SPECIAL CARE FACTORS FREQUENCY  OT (By licensed OT), PT (By licensed PT)     PT Frequency: 5x week OT  Frequency: 5x week            Contractures Contractures Info: Not present    Additional Factors Info  Code Status, Allergies, Psychotropic Code Status Info: DNR Allergies Info: Contrast Media (Iodinated Diagnostic Agents), Iodine Psychotropic Info: risperiDONE (RISPERDAL) tablet 0.5 mg daily at bedtime         Current Medications (03/24/2019):  This is the current hospital active medication list Current Facility-Administered Medications  Medication Dose Route Frequency Provider Last Rate Last Dose  . acetaminophen (TYLENOL) tablet 650 mg  650 mg Oral Q6H PRN Norins, Heinz Knuckles, MD       Or  . acetaminophen (TYLENOL) suppository 650 mg  650 mg Rectal Q6H PRN Norins, Heinz Knuckles, MD      . aspirin tablet 325 mg  325 mg Oral Daily Norins, Heinz Knuckles, MD   325 mg at 03/24/19 0824  . atorvastatin (LIPITOR) tablet 40 mg  40 mg Oral QHS Norins, Heinz Knuckles, MD   40 mg at 03/23/19 2140  . cefTRIAXone (ROCEPHIN) 1 g in sodium chloride 0.9 % 100 mL IVPB  1 g Intravenous Q24H Ghimire, Dante Gang, MD      . enoxaparin (LOVENOX) injection 40 mg  40 mg Subcutaneous Daily Norins, Heinz Knuckles, MD   40 mg at 03/24/19 2671  . ferrous sulfate tablet 325 mg  325 mg Oral BID WC Norins, Heinz Knuckles, MD   325 mg at 03/24/19 2458  . levothyroxine (SYNTHROID) tablet 50 mcg  50 mcg Oral Q0600 Norins, Heinz Knuckles, MD  50 mcg at 03/24/19 0644  . Melatonin TABS 3 mg  1 tablet Oral QHS Norins, Heinz Knuckles, MD      . metoprolol tartrate (LOPRESSOR) tablet 100 mg  100 mg Oral Daily Norins, Heinz Knuckles, MD   100 mg at 03/24/19 0824  . pantoprazole (PROTONIX) EC tablet 40 mg  40 mg Oral Daily Norins, Heinz Knuckles, MD   40 mg at 03/24/19 3200  . risperiDONE (RISPERDAL) tablet 0.5 mg  0.5 mg Oral QHS Norins, Heinz Knuckles, MD      . senna [facility]) tablet 8.6 mg  1 tablet Oral BID Neena Rhymes, MD   8.6 mg at 03/24/19 3794     Discharge Medications: Please see discharge summary for a list of discharge medications.  Relevant Imaging  Results:  Relevant Lab Results:   Additional Information SS#[id number] [place], [place]

## 2019-03-25 LAB — BASIC METABOLIC PANEL
Anion gap: 10 (ref 5–15)
BUN: 15 mg/dL (ref 8–23)
CO2: 24 mmol/L (ref 22–32)
Calcium: 8.9 mg/dL (ref 8.9–10.3)
Chloride: 96 mmol/L — ABNORMAL LOW (ref 98–111)
Creatinine, Ser: 0.93 mg/dL (ref 0.44–1.00)
GFR calc Af Amer: >60 mL/min (ref >60)
GFR calc non Af Amer: 56 mL/min — ABNORMAL LOW (ref >60)
Glucose, Bld: 105 mg/dL — ABNORMAL HIGH (ref 70–99)
Potassium: 3.6 mmol/L (ref 3.5–5.1)
Sodium: 130 mmol/L — ABNORMAL LOW (ref 135–145)

## 2019-03-25 LAB — URINE CULTURE: Culture: >=100000 — AB

## 2019-03-25 LAB — CBC
HCT: 37.8 % (ref 36.0–46.0)
Hemoglobin: 12.8 g/dL (ref 12.0–15.0)
MCH: 28.9 pg (ref 26.0–34.0)
MCHC: 33.9 g/dL (ref 30.0–36.0)
MCV: 85.3 fL (ref 80.0–100.0)
Platelets: 285 10*3/uL (ref 150–400)
RBC: 4.43 MIL/uL (ref 3.87–5.11)
RDW: 12.9 % (ref 11.5–15.5)
WBC: 5.9 10*3/uL (ref 4.0–10.5)
nRBC: 0 % (ref 0.0–0.2)

## 2019-03-25 MED ORDER — HYDROXYZINE HCL 10 MG PO TABS
10.0000 mg | ORAL_TABLET | Freq: Three times a day (TID) | ORAL | Status: DC | PRN
  Filled 2019-03-25: qty 1

## 2019-03-25 MED ORDER — ADULT MULTIVITAMIN W/MINERALS CH
1.0000 | ORAL_TABLET | Freq: Every day | ORAL | Status: DC
  Administered 2019-03-25 – 2019-03-26 (×2): 1 via ORAL
  Filled 2019-03-25 (×2): qty 1

## 2019-03-25 MED ORDER — ALPRAZOLAM 0.25 MG PO TABS
0.2500 mg | ORAL_TABLET | Freq: Two times a day (BID) | ORAL | Status: DC
  Administered 2019-03-25 – 2019-03-26 (×3): 0.25 mg via ORAL
  Filled 2019-03-25 (×3): qty 1

## 2019-03-25 MED ORDER — ENSURE ENLIVE PO LIQD
237.0000 mL | Freq: Two times a day (BID) | ORAL | Status: DC
  Administered 2019-03-25 – 2019-03-26 (×3): 237 mL via ORAL

## 2019-03-25 NOTE — Progress Notes (Signed)
[facility] PROGRESS NOTE  Sarah Acevedo SJG:[id number] DOB: 03/09/1933 DOA: 03/22/2019 PCP: Jonathon Jordan, MD  Assessment/Plan:  Acute UTI present on admission: she remains afebrile and non-toxic appearing. Urine cultures with greater than 1000,000 colonies gm neg rods.  Provided with  Rocephin. -continue rocephin day #3.  -monitor  Failure to thrive/profound physical debility: Multiple ongoing medical problems in setting of acute UTI. Evaluated by PT/OT who recommend facility. Patient and daughter agree. Patient more engaging this am. Eating 75% of breakfast -monitor intake and output -SW for placement  Hypothyroidism: Euthyroid on current regimen.  Depression: more talkative this am. Complains of feeling "anxious". Not able to say specifically what she is anxious about.  Home meds include xanax, risperdal. Xanax was on hold. She reports she take .25mg  bid -continue risperadal -resume Xanax -prn atarax per home med list -monitor  Hypertension:  Fair control. Home meds include BB -continue BB   Code Status: dnr Family Communication: daughter left message Disposition Plan: snf   Consultants:    Procedures:    Antibiotics:  Rocephin day #3  HPI/Subjective: Sitting up in chair eating breakfast. Denies pain/discomfort. Reports feeling "anxious"  Objective: Vitals:   03/24/19 2051 03/25/19 0433  BP: 123/65 (!) 143/105  Pulse: (!) 58 (!) 57  Resp: 18 16  Temp: 97.7 F (36.5 C) 97.6 F (36.4 C)  SpO2: 97% 97%    Intake/Output Summary (Last 24 hours) at 03/25/2019 1001 Last data filed at 03/24/2019 1300 Gross per 24 hour  Intake 240 ml  Output -  Net 240 ml   Filed Weights   03/22/19 2200  Weight: 47.1 kg    Exam:   General:  Awake alert thin and frail appearing  Cardiovascular: rrr no mgr no LE edema  Respiratory: normal effort BS clear bilaterally no wheeze  Abdomen: non-distended non-tender +BS no guarding or  rebounding  Musculoskeletal: joints without swelling/erythema   Data Reviewed: Basic Metabolic Panel: Recent Labs  Lab 03/22/19 1224 03/22/19 1910 03/23/19 0220 03/25/19 0534  NA 131*  --  131* 130*  K 3.9  --  3.5 3.6  CL 98  --  101 96*  CO2 22  --  20* 24  GLUCOSE 115*  --  115* 105*  BUN 24*  --  17 15  CREATININE 1.02* 0.94 0.76 0.93  CALCIUM 9.1  --  8.5* 8.9   Liver Function Tests: No results for input(s): AST, ALT, ALKPHOS, BILITOT, PROT, ALBUMIN in the last 168 hours. No results for input(s): LIPASE, AMYLASE in the last 168 hours. No results for input(s): AMMONIA in the last 168 hours. CBC: Recent Labs  Lab 03/22/19 1224 03/22/19 1910 03/25/19 0534  WBC 7.8 6.2 5.9  NEUTROABS 4.9  --   --   HGB 13.7 13.5 12.8  HCT 40.9 40.8 37.8  MCV 87.0 88.1 85.3  PLT 311 271 285   Cardiac Enzymes: No results for input(s): CKTOTAL, CKMB, CKMBINDEX, TROPONINI in the last 168 hours. BNP (last 3 results) No results for input(s): BNP in the last 8760 hours.  ProBNP (last 3 results) No results for input(s): PROBNP in the last 8760 hours.  CBG: No results for input(s): GLUCAP in the last 168 hours.  Recent Results (from the past 240 hour(s))  Urine culture     Status: Abnormal   Collection Time: 03/22/19  2:15 PM   Specimen: Urine, Random  Result Value Ref Range Status   Specimen Description URINE, RANDOM  Final   Special Requests  Final    NONE Performed at [facility], [place] [address], [place], [place] [address]    Culture >=100,000 COLONIES/mL KLEBSIELLA PNEUMONIAE (A)  Final   Report Status 03/25/2019 FINAL  Final   Organism ID, Bacteria KLEBSIELLA PNEUMONIAE (A)  Final      Susceptibility   Klebsiella pneumoniae - MIC*    AMPICILLIN >=32 RESISTANT Resistant     CEFAZOLIN <=4 SENSITIVE Sensitive     CEFTRIAXONE <=1 SENSITIVE Sensitive     CIPROFLOXACIN <=0.25 SENSITIVE Sensitive     GENTAMICIN <=1 SENSITIVE Sensitive     IMIPENEM 0.5 SENSITIVE  Sensitive     NITROFURANTOIN 64 INTERMEDIATE Intermediate     TRIMETH/SULFA <=20 SENSITIVE Sensitive     AMPICILLIN/SULBACTAM 8 SENSITIVE Sensitive     PIP/TAZO <=4 SENSITIVE Sensitive     Extended ESBL NEGATIVE Sensitive     * >=100,000 COLONIES/mL KLEBSIELLA PNEUMONIAE  SARS Coronavirus 2 (CEPHEID - Performed in [place] hospital lab), Hosp Order     Status: None   Collection Time: 03/22/19  7:10 PM   Specimen: Nasopharyngeal Swab  Result Value Ref Range Status   SARS Coronavirus 2 NEGATIVE NEGATIVE Final    Comment: (NOTE) If result is NEGATIVE SARS-CoV-2 target nucleic acids are NOT DETECTED. The SARS-CoV-2 RNA is generally detectable in upper and lower  respiratory specimens during the acute phase of infection. The lowest  concentration of SARS-CoV-2 viral copies this assay can detect is 250  copies / mL. A negative result does not preclude SARS-CoV-2 infection  and should not be used as the sole basis for treatment or other  patient management decisions.  A negative result may occur with  improper specimen collection / handling, submission of specimen other  than nasopharyngeal swab, presence of viral mutation(s) within the  areas targeted by this assay, and inadequate number of viral copies  (<250 copies / mL). A negative result must be combined with clinical  observations, patient history, and epidemiological information. If result is POSITIVE SARS-CoV-2 target nucleic acids are DETECTED. The SARS-CoV-2 RNA is generally detectable in upper and lower  respiratory specimens dur ing the acute phase of infection.  Positive  results are indicative of active infection with SARS-CoV-2.  Clinical  correlation with patient history and other diagnostic information is  necessary to determine patient infection status.  Positive results do  not rule out bacterial infection or co-infection with other viruses. If result is PRESUMPTIVE POSTIVE SARS-CoV-2 nucleic acids MAY BE PRESENT.    A presumptive positive result was obtained on the submitted specimen  and confirmed on repeat testing.  While 2019 novel coronavirus  (SARS-CoV-2) nucleic acids may be present in the submitted sample  additional confirmatory testing may be necessary for epidemiological  and / or clinical management purposes  to differentiate between  SARS-CoV-2 and other Sarbecovirus currently known to infect humans.  If clinically indicated additional testing with an alternate test  methodology [phone]) is advised. The SARS-CoV-2 RNA is generally  detectable in upper and lower respiratory sp ecimens during the acute  phase of infection. The expected result is Negative. Fact Sheet for Patients:  StrictlyIdeas.no Fact Sheet for Healthcare Providers: BankingDealers.co.za This test is not yet approved or cleared by the [place] FDA and has been authorized for detection and/or diagnosis of SARS-CoV-2 by FDA under an Emergency Use Authorization (EUA).  This EUA will remain in effect (meaning this test can be used) for the duration of the COVID-19 declaration under Section 564(b)(1) of  the Act, 21 U.S.C. section 360bbb-3(b)(1), unless the authorization is terminated or revoked sooner. Performed at [facility], [place] [address], [place], [place] [address]      Studies: No results found.  Scheduled Meds: . ALPRAZolam  0.25 mg Oral BID  . aspirin  325 mg Oral Daily  . atorvastatin  40 mg Oral QHS  . enoxaparin (LOVENOX) injection  40 mg Subcutaneous Daily  . ferrous sulfate  325 mg Oral BID WC  . levothyroxine  50 mcg Oral Q0600  . Melatonin  1 tablet Oral QHS  . metoprolol tartrate  100 mg Oral Daily  . pantoprazole  40 mg Oral Daily  . risperiDONE  0.5 mg Oral QHS  . senna  1 tablet Oral BID   Continuous Infusions: . cefTRIAXone (ROCEPHIN)  IV 1 g (03/25/19 0958)    Principal Problem:   UTI (urinary tract infection) Active  Problems:   AKI (acute kidney injury) ([place])   Failure to thrive in adult   Essential hypertension   Hypothyroidism   Depression   Lipid disorder    Time spent: 61 minutes    [place] NP  [facility]  If 7PM-7AM, please contact night-coverage at [email or web address], password [facility] 03/25/2019, 10:01 AM  LOS: 2 days

## 2019-03-25 NOTE — Plan of Care (Signed)
  Problem: Pain Managment: Goal: General experience of comfort will improve Outcome: Progressing   Problem: Safety: Goal: Ability to remain free from injury will improve Outcome: Progressing   Problem: Skin Integrity: Goal: Risk for impaired skin integrity will decrease Outcome: Progressing   

## 2019-03-25 NOTE — Progress Notes (Addendum)
Initial Nutrition Assessment  RD working remotely.  DOCUMENTATION CODES:   Not applicable  INTERVENTION:   -Ensure Enlive po BID, each supplement provides 350 kcal and 20 grams of protein -Magic Cup TID with meals, each supplement provides 290 kcals and 9 grams protein -MVI with minerals daily -Continue with liberalized diet of regular to provide widest variety of food choices to assist with improvement of PO intake  NUTRITION DIAGNOSIS:   Inadequate oral intake related to decreased appetite as evidenced by meal completion < 50%.  GOAL:   Patient will meet greater than or equal to 90% of their needs  MONITOR:   PO intake, Supplement acceptance, Labs, Weight trends, Skin, I & O's  REASON FOR ASSESSMENT:   Malnutrition Screening Tool    ASSESSMENT:   Sarah Acevedo is a 85 y.o. female with medical history significant of hypertension, hypothyroidism, major depression, lid lag right eye.  Patient has had increasing weakness and has difficulty ambulating, which is a change.  Prior to admission to skilled living 2 weeks ago patient had been independent in ADLs with supervision and moderate assistance. She was able to ambulate about her abode.  Patient reports feeling ill but has no specific, or focal complaint.  By her report she is not eating or drinking well.  She does report having lost weight,  confirmed by her daughter.  Pt admitted with adult FTT secondary to UTI.   Reviewed I/O's: +360 ml x 24 hours and +2.4 L since admission  Attempted to speak with pt via phone to obtain further history, however, no answer. Per chart review, pt with intermittent confusion and with flat affect.   Per chart review, pt has experienced decreased appetite and poor oral intake over the past several weeks. Pt also endorses weight loss, however, no recent wt hx available to assess at this time.   Pt is able to tolerate meals well per RN notes, however, documented meal completion 10-50%. Pt would  benefit from addition of oral nutrition supplements to assist pt in meeting nutritional needs.   Per CSW notes, plan to d/c to SNF once pt is medically ready for discharge.   Labs reviewed: Na: 130.   NUTRITION - FOCUSED PHYSICAL EXAM:    Most Recent Value  Orbital Region  Unable to assess  Upper Arm Region  Unable to assess  Thoracic and Lumbar Region  Unable to assess  Buccal Region  Unable to assess  Temple Region  Unable to assess  Clavicle Bone Region  Unable to assess  Clavicle and Acromion Bone Region  Unable to assess  Scapular Bone Region  Unable to assess  Dorsal Hand  Unable to assess  Patellar Region  Unable to assess  Anterior Thigh Region  Unable to assess  Posterior Calf Region  Unable to assess  Edema (RD Assessment)  Unable to assess  Hair  Unable to assess  Eyes  Unable to assess  Mouth  Unable to assess  Skin  Unable to assess  Nails  Unable to assess       Diet Order:   Diet Order            Diet regular Room service appropriate? Yes; Fluid consistency: Thin  Diet effective now              EDUCATION NEEDS:   No education needs have been identified at this time  Skin:  Skin Assessment: Reviewed RN Assessment  Last BM:  03/23/19  Height:   Ht Readings  from Last 1 Encounters:  03/22/19 5\' 2"  (1.575 m)    Weight:   Wt Readings from Last 1 Encounters:  03/22/19 47.1 kg    Ideal Body Weight:  50 kg  BMI:  Body mass index is 18.99 kg/m.  Estimated Nutritional Needs:   Kcal:  1200-1400  Protein:  55-70 grams  Fluid:  > 1.2 L    Treyshawn Muldrew A. Jimmye Norman, RD, LDN, [place] Registered Dietitian II Certified Diabetes Care and Education Specialist Pager: [phone] After hours Pager: [phone]

## 2019-03-25 NOTE — Progress Notes (Signed)
Occupational Therapy Treatment Patient Details Name: Sarah Acevedo MRN: [id number] DOB: 03/09/1933 Today's Date: 03/25/2019    History of present illness Pt is a 85 y.o. 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 Over past 2 weeks, pt with progressive weakness, weight loss, and failure to thrive. She is a resident of [facility]. Admitted with AKI, UTI.     OT comments  Pt progressing toward established goals. Pt continues to demonstrate cognitive limitations and physical limitations impacting her safety with ADL/IADL completion and functional mobility. Pt currently requires minA for ADL and minA for functional mobility at RW level. Pt will continue to benefit from skilled OT services to maximize safety and independence with ADL/IADL and functional mobility. Will continue to follow acutely and progress as tolerated. Continue to recommend SNF level therapies following d/c.      Follow Up Recommendations  SNF;Supervision/Assistance - 24 hour    Equipment Recommendations  None recommended by OT    Recommendations for Other Services PT consult    Precautions / Restrictions Precautions Precautions: Fall;Other (comment) Precaution Comments: Right eye esotropia and visual deficits Restrictions Weight Bearing Restrictions: No       Mobility Bed Mobility Overal bed mobility: Needs Assistance Bed Mobility: Supine to Sit;Sit to Supine     Supine to sit: Min assist Sit to supine: Min guard   General bed mobility comments: minA to progress trunk to upright position  Transfers Overall transfer level: Needs assistance Equipment used: Rolling walker (2 wheeled) Transfers: Sit to/from Stand Sit to Stand: Min assist         General transfer comment: minA to progress to standing;demonstrates preference for pulling up on RW    Balance Overall balance assessment: Needs  assistance Sitting-balance support: Feet supported Sitting balance-Leahy Scale: Fair     Standing balance support: Bilateral upper extremity supported Standing balance-Leahy Scale: Fair Standing balance comment: Able to maintain static standing at sink without UE support                           ADL either performed or assessed with clinical judgement   ADL Overall ADL's : Needs assistance/impaired Eating/Feeding: Set up;Sitting   Grooming: Minimal assistance;Standing Grooming Details (indicate cue type and reason): minA for safety standing at sink             Lower Body Dressing: Minimal assistance;Sit to/from stand   Toilet Transfer: Minimal assistance;Ambulation;Regular Toilet;Grab bars;RW Armed forces technical officer Details (indicate cue type and reason): simulated minA for powerup into standing Toileting- Clothing Manipulation and Hygiene: Minimal assistance;Sitting/lateral lean;Sit to/from stand       Functional mobility during ADLs: Min guard;Minimal assistance;Rolling walker General ADL Comments: decreased balance and cognition impacting safety with ALD and functional mobility     Vision       Perception     Praxis      Cognition Arousal/Alertness: Awake/alert Behavior During Therapy: Flat affect;Impulsive Overall Cognitive Status: Impaired/Different from baseline Area of Impairment: Orientation;Attention;Following commands;Awareness;Safety/judgement                 Orientation Level: Disoriented to;Place;Time;Situation Current Attention Level: Focused   Following Commands: Follows one step commands inconsistently Safety/Judgement: Decreased awareness of safety Awareness: Intellectual   General Comments: Pt stated she was "at a house";pt requires vc for safety throughout functional mobility        Exercises  Shoulder Instructions       General Comments VSS throughout    Pertinent Vitals/ Pain       Pain Assessment: No/denies  pain  Home Living                                          Prior Functioning/Environment              Frequency  Min 2X/week        Progress Toward Goals  OT Goals(current goals can now be found in the care plan section)  Progress towards OT goals: Progressing toward goals  Acute Rehab OT Goals Patient Stated Goal: none stated OT Goal Formulation: With patient Time For Goal Achievement: 04/06/19 Potential to Achieve Goals: Good ADL Goals Pt Will Perform Grooming: with modified independence;standing Pt Will Perform Upper Body Dressing: with modified independence;sitting Pt Will Perform Lower Body Dressing: with modified independence;sit to/from stand Pt Will Transfer to Toilet: with modified independence;ambulating;regular height toilet Pt Will Perform Toileting - Clothing Manipulation and hygiene: with modified independence;sitting/lateral leans;sit to/from stand  Plan Discharge plan remains appropriate    Co-evaluation                 AM-PAC OT "6 Clicks" Daily Activity     Outcome Measure   Help from another person eating meals?: A Little Help from another person taking care of personal grooming?: A Little Help from another person toileting, which includes using toliet, bedpan, or urinal?: A Little Help from another person bathing (including washing, rinsing, drying)?: A Little Help from another person to put on and taking off regular upper body clothing?: A Little Help from another person to put on and taking off regular lower body clothing?: A Little 6 Click Score: 18    End of Session Equipment Utilized During Treatment: Rolling walker  OT Visit Diagnosis: Unsteadiness on feet (R26.81);Other abnormalities of gait and mobility (R26.89);Muscle weakness (generalized) (M62.81);Other symptoms and signs involving cognitive function;Pain   Activity Tolerance Patient tolerated treatment well   Patient Left in bed;with call bell/phone  within reach;with bed alarm set   Nurse Communication Mobility status        Time: 1331-1346 OT Time Calculation (min): 15 min  Charges: OT General Charges $OT Visit: 1 Visit OT Treatments $Self Care/Home Management : 8-22 mins  [place] Office: [place] 03/25/2019, 1:52 PM

## 2019-03-25 NOTE — Progress Notes (Signed)
Pt's son in law is allowed to come visit with her due to altered mental status at admission. Please evaluate mental status changes and advise as pt has not been very confused today and easy to redirect,also very appropriate in her interactions

## 2019-03-25 NOTE — Progress Notes (Signed)
Physical Therapy Treatment Patient Details Name: Sarah Acevedo MRN: [id number] DOB: 03/09/1933 Today's Date: 03/25/2019    History of Present Illness Pt is a 85 y.o. 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 Over past 2 weeks, pt with progressive weakness, weight loss, and failure to thrive. She is a resident of [facility]. Admitted with AKI, UTI.      PT Comments    Patient continues to require min a for all mobility. Her gait distance was limited today 2nd to urination in the bed and having to be cleaned. She would benefit from skilled therapy to improve endurance and ability to transfer. Current plan remains appropriate.     Follow Up Recommendations  SNF;Supervision/Assistance - 24 hour     Equipment Recommendations  None recommended by PT    Recommendations for Other Services Rehab consult     Precautions / Restrictions Precautions Precautions: Fall;Other (comment) Precaution Comments: Right eye esotropia and visual deficits Restrictions Weight Bearing Restrictions: No    Mobility  Bed Mobility Overal bed mobility: Needs Assistance Bed Mobility: Supine to Sit     Supine to sit: Min assist Sit to supine: Min guard   General bed mobility comments: min a to sit and scoot to the edge of the bed   Transfers Overall transfer level: Needs assistance Equipment used: Rolling walker (2 wheeled) Transfers: Sit to/from Stand Sit to Stand: Min assist         General transfer comment: min a for balance to stand. Intiialyl hoped to get to bathroom but began urinating as she sat. She was transferd to commode with RW   Ambulation/Gait Ambulation/Gait assistance: Min assist Gait Distance (Feet): 3 Feet Assistive device: Rolling walker (2 wheeled)   Gait velocity: decreased   General Gait Details: only three feet today. She sat on the commode for several minutes  before CNA came. She needed to be cleaned up and changed. Therapy left her with the CNA so the CNA could take her time    Stairs             Wheelchair Mobility    Modified Rankin (Stroke Patients Only)       Balance Overall balance assessment: Needs assistance Sitting-balance support: Feet supported Sitting balance-Leahy Scale: Fair     Standing balance support: Bilateral upper extremity supported Standing balance-Leahy Scale: Fair Standing balance comment: Able to maintain static standing at sink without UE support                            Cognition Arousal/Alertness: Awake/alert Behavior During Therapy: Flat affect;Impulsive Overall Cognitive Status: Impaired/Different from baseline Area of Impairment: Orientation;Attention;Following commands;Awareness;Safety/judgement                 Orientation Level: Disoriented to;Place;Time;Situation Current Attention Level: Focused   Following Commands: Follows one step commands inconsistently Safety/Judgement: Decreased awareness of safety Awareness: Intellectual   General Comments: appears to be slightly confused but pleasent and follows all commands       Exercises      General Comments General comments (skin integrity, edema, etc.): VSS throughout      Pertinent Vitals/Pain Pain Assessment: No/denies pain    Home Living                      Prior Function  PT Goals (current goals can now be found in the care plan section) Acute Rehab PT Goals Patient Stated Goal: none stated PT Goal Formulation: Patient unable to participate in goal setting Time For Goal Achievement: 04/06/19 Potential to Achieve Goals: Fair Progress towards PT goals: Progressing toward goals    Frequency    Min 2X/week      PT Plan Current plan remains appropriate    Co-evaluation              AM-PAC PT "6 Clicks" Mobility   Outcome Measure  Help needed turning from your back  to your side while in a flat bed without using bedrails?: A Lot Help needed moving from lying on your back to sitting on the side of a flat bed without using bedrails?: A Little Help needed moving to and from a bed to a chair (including a wheelchair)?: A Little Help needed standing up from a chair using your arms (e.g., wheelchair or bedside chair)?: A Lot Help needed to walk in hospital room?: A Lot Help needed climbing 3-5 steps with a railing? : A Lot 6 Click Score: 14    End of Session Equipment Utilized During Treatment: Gait belt Activity Tolerance: Patient tolerated treatment well Patient left: in chair;with call bell/phone within reach;with nursing/sitter in room Nurse Communication: Mobility status PT Visit Diagnosis: Unsteadiness on feet (R26.81);Muscle weakness (generalized) (M62.81);Difficulty in walking, not elsewhere classified (R26.2);Adult, failure to thrive (R62.7)     Time: 6546-5035 PT Time Calculation (min) (ACUTE ONLY): 15 min  Charges:  $Therapeutic Activity: 8-22 mins                        Carney Living PT DPT  03/25/2019, 3:18 PM

## 2019-03-25 NOTE — TOC Progression Note (Signed)
Transition of Care [facility]) - Progression Note    Patient Details  Name: Sarah Acevedo MRN: [id number] Date of Birth: 03/09/1933  Transition of Care [facility]) CM/SW [place], [place] Phone Number: 03/25/2019, 1:31 PM  Clinical Narrative:    CSW has spoken with pt daughter Jenny Reichmann several times today regarding placement offers.  At this time pt daughter still mulling over offers of SNF vs return to ALF with therapies.  CSW spoke with Abigail Butts at [facility] and they will send paperwork to be completed in the event that the pt and pt daughter would prefer to return there.   Continuing to follow, await return call from [place].   Expected Discharge Plan: [place] Barriers to Discharge: Continued Medical Work up, Environmental education officer)  Expected Discharge Plan and Services Expected Discharge Plan: [place] In-house Referral: Clinical Social Work Discharge Planning Services: CM Consult Post Acute Care Choice: [place] arrangements for the past 2 months: [place], [place]                    Social Determinants of Health (SDOH) Interventions    Readmission Risk Interventions No flowsheet data found.

## 2019-03-26 DIAGNOSIS — I10 Essential (primary) hypertension: Secondary | ICD-10-CM

## 2019-03-26 LAB — BASIC METABOLIC PANEL
Anion gap: 8 (ref 5–15)
BUN: 15 mg/dL (ref 8–23)
CO2: 27 mmol/L (ref 22–32)
Calcium: 8.9 mg/dL (ref 8.9–10.3)
Chloride: 96 mmol/L — ABNORMAL LOW (ref 98–111)
Creatinine, Ser: 0.95 mg/dL (ref 0.44–1.00)
GFR calc Af Amer: >60 mL/min (ref >60)
GFR calc non Af Amer: 55 mL/min — ABNORMAL LOW (ref >60)
Glucose, Bld: 103 mg/dL — ABNORMAL HIGH (ref 70–99)
Potassium: 3.4 mmol/L — ABNORMAL LOW (ref 3.5–5.1)
Sodium: 131 mmol/L — ABNORMAL LOW (ref 135–145)

## 2019-03-26 LAB — NOVEL CORONAVIRUS, NAA (HOSP ORDER, SEND-OUT TO REF LAB; TAT 18-24 HRS): SARS-CoV-2, NAA: NOT DETECTED

## 2019-03-26 MED ORDER — ALPRAZOLAM 0.25 MG PO TABS: 0.2500 mg | ORAL_TABLET | Freq: Two times a day (BID) | ORAL | 0 refills | Status: DC | PRN

## 2019-03-26 MED ORDER — CEPHALEXIN 250 MG PO CAPS
250.0000 mg | ORAL_CAPSULE | Freq: Three times a day (TID) | ORAL | Status: DC
  Administered 2019-03-26 (×2): 250 mg via ORAL
  Filled 2019-03-26 (×2): qty 1

## 2019-03-26 MED ORDER — POTASSIUM CHLORIDE CRYS ER 20 MEQ PO TBCR
40.0000 meq | EXTENDED_RELEASE_TABLET | Freq: Once | ORAL | Status: AC
  Administered 2019-03-26: 40 meq via ORAL
  Filled 2019-03-26: qty 2

## 2019-03-26 MED ORDER — CEPHALEXIN 250 MG PO CAPS: 250.0000 mg | ORAL_CAPSULE | Freq: Three times a day (TID) | ORAL | 0 refills | Status: AC

## 2019-03-26 NOTE — Progress Notes (Signed)
Patient discharged to [facility] via [facility] transporter, reported to nurse Lenord Carbo.

## 2019-03-26 NOTE — Social Work (Signed)
Clinical Social Worker facilitated patient discharge including contacting patient family and facility to confirm patient discharge plans.  Clinical information faxed to facility and family agreeable with plan.  CSW arranged ambulance transport via [facility] to [place] to call [phone]  with report prior to discharge.  Clinical Social Worker will sign off for now as social work intervention is no longer needed. Please consult [place] again if new need arises.  Westley Hummer, MSW, [place] Social Worker [phone]

## 2019-03-26 NOTE — Care Management Important Message (Signed)
Important Message  Patient Details  Name: Sarah Acevedo MRN: [id number] Date of Birth: [birth year]   Medicare Important Message Given:  Yes     Memory Argue 03/26/2019, 4:19 PM   SIGNED

## 2019-03-26 NOTE — Discharge Summary (Signed)
Physician Discharge Summary  Sarah Acevedo MVE:[id number] DOB: 03/09/1933 DOA: 03/22/2019  PCP: Jonathon Jordan, MD  Admit date: 03/22/2019 Discharge date: 03/26/2019  Time spent: 35 minutes  Recommendations for Outpatient Follow-up:  1. PCP in 1 week, consider low-dose SSRI 2. Consider palliative care evaluation if ongoing decline continues   Discharge Diagnoses:  Principal Problem:   UTI (urinary tract infection) Active Problems:   AKI (acute kidney injury) ([place])   Hypothyroidism   Failure to thrive in adult   Lipid disorder   Essential hypertension   Depression   Discharge Condition: Stable  Diet recommendation: Low-sodium, heart healthy  Filed Weights   03/22/19 2200  Weight: 47.1 kg    History of present illness:  85 year old female with history of hypertension hypothyroidism, major depression, right eye lid lag presented to the ED 7/18 with worsening generalized weakness, she presented from her assisted living facility, upon evaluation in the emergency room noted to have mild acute kidney injury, abnormal urinalysis with positive nitrite leukocyte Estrase  Hospital Course:   Klebsiella UTI -Initially treated with IV ceftriaxone, subsequently transitioned to Keflex at discharge for 2 more days  Adult failure to thrive -Multifactorial, due to advanced age, depression, physical debility polypharmacy, worsened by infection -PT OT evaluation completed, SNF recommended for rehabilitation -Olanzapine discontinued due to oversedation -She will be discharged to SNF for rehab -Consider palliative care evaluation if clinical decline continues  Hypothyroidism -Euthyroid on current regimen  Depression anxiety -Slightly worsening depression, continued on home regimen of low-dose Xanax as needed, no longer taking risperidone per patient, olanzapine was discontinued on admission due to oversedation -We will defer to PCP regarding starting a low-dose  SSRI  Hypertension -Stable, continued on beta-blocker  Discharge Exam: Vitals:   03/25/19 2030 03/26/19 0544  BP: (!) 99/49 (!) 147/69  Pulse: 62 68  Resp: 14 20  Temp: 98.6 F (37 C) (!) 97.5 F (36.4 C)  SpO2: 95% 98%    General: AAO x3, flat affect Cardiovascular: S1S2/RRR Respiratory: CTAB  Discharge Instructions   Discharge Instructions    Diet - low sodium heart healthy   Complete by: As directed    Increase activity slowly   Complete by: As directed      Allergies as of 03/26/2019      Reactions   Contrast Media [iodinated Diagnostic Agents] Hives   Not listed on MAR   Iodine Hives   Not listed on MAR      Medication List    STOP taking these medications   atorvastatin 40 MG tablet Commonly known as: LIPITOR   OLANZapine 15 MG tablet Commonly known as: ZYPREXA   risperiDONE 0.5 MG tablet Commonly known as: RISPERDAL     TAKE these medications   ALPRAZolam 0.25 MG tablet Commonly known as: XANAX Take 1 tablet (0.25 mg total) by mouth 2 (two) times daily as needed for anxiety. What changed:   when to take this  reasons to take this   aspirin 325 MG tablet Take 325 mg by mouth daily.   cephALEXin 250 MG capsule Commonly known as: KEFLEX Take 1 capsule (250 mg total) by mouth every 8 (eight) hours for 2 days.   ferrous sulfate 325 (65 FE) MG tablet Take 325 mg by mouth 2 (two) times daily with a meal.   hydrOXYzine 10 MG tablet Commonly known as: ATARAX/VISTARIL Take 10 mg by mouth every 8 (eight) hours as needed for anxiety.   levothyroxine 50 MCG tablet Commonly known as: SYNTHROID Take 50  mcg by mouth daily.   Melatonin 3 MG Tabs Take 6 mg by mouth at bedtime.   metoprolol tartrate 100 MG tablet Commonly known as: LOPRESSOR Take 100 mg by mouth every morning.   omeprazole 20 MG capsule Commonly known as: PRILOSEC Take 20 mg by mouth daily.   traZODone 50 MG tablet Commonly known as: DESYREL Take 50 mg by mouth at  bedtime.      Allergies  Allergen Reactions  . Contrast Media [Iodinated Diagnostic Agents] Hives    Not listed on MAR  . Iodine Hives    Not listed on [facility]      The results of significant diagnostics from this hospitalization (including imaging, microbiology, ancillary and laboratory) are listed below for reference.    Significant Diagnostic Studies: Ct Head Wo Contrast  Result Date: 03/22/2019 CLINICAL DATA:  85 year old nursing home patient presenting with RIGHT eye pain and swelling. There is no drainage from the RIGHT eye. EXAM: CT HEAD AND ORBITS WITHOUT CONTRAST TECHNIQUE: Contiguous axial images were obtained from the base of the skull through the vertex without contrast. Multidetector CT imaging of the orbits was performed using the standard protocol without intravenous contrast. A metallic BB was placed on the right temple in order to reliably differentiate right from left. COMPARISON:  Head CT 07/17/2014. No prior orbital CT. FINDINGS: CT HEAD FINDINGS Brain: Ventricular system normal in size and appearance for age. Mild to moderate age related cortical atrophy, mildly progressive since 2015. Mild changes of small vessel disease of the white matter diffusely, unchanged. No midline shift. No acute hemorrhage or hematoma. No extra-axial fluid collections. No evidence of acute infarction. Approximate 2.6 x 2.8 x 2.3 cm heterogeneous mass with its epicenter in the RIGHT cavernous sinus, partially calcified. No masses elsewhere. Vascular: Severe BILATERAL carotid siphon and mild LEFT vertebral artery atherosclerosis. No hyperdense vessel. Skull: No skull fracture or other focal osseous abnormality involving the skull. Other: None. CT ORBITS FINDINGS Orbits: No orbital fractures. No evidence of orbital hemorrhage or hematoma. No acute inflammatory changes involving the RIGHT orbital fat. Ocular muscles normal in appearance and symmetric bilaterally. Dysconjugate gaze is suspected. Hyperdense  RIGHT lens compared to that on the LEFT. Visualized sinuses: Mucous retention cyst or polyp in the LEFT maxillary sinus. Remaining visualized paranasal sinuses, BILATERAL mastoid air cells and BILATERAL middle ear cavities well-aerated. Soft tissues: Unremarkable. IMPRESSION: 1. No acute intracranial abnormality. 2. Mild to moderate age related cortical atrophy and mild chronic microvascular ischemic changes of the white matter diffusely. 3. 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 4. No orbital fractures identified. 5. Hyperdense RIGHT lens compared to that on the LEFT, query cataract. 6. Mild chronic left maxillary sinus disease. MRI of the brain without and with contrast is recommended in further evaluation to better characterize the mass. Electronically Signed   By: Evangeline Dakin M.D.   On: 03/22/2019 12:40   Ct Orbits Wo Contrast  Result Date: 03/22/2019 CLINICAL DATA:  85 year old nursing home patient presenting with RIGHT eye pain and swelling. There is no drainage from the RIGHT eye. EXAM: CT HEAD AND ORBITS WITHOUT CONTRAST TECHNIQUE: Contiguous axial images were obtained from the base of the skull through the vertex without contrast. Multidetector CT imaging of the orbits was performed using the standard protocol without intravenous contrast. A metallic BB was placed on the right temple in  order to reliably differentiate right from left. COMPARISON:  Head CT 07/17/2014. No prior orbital CT. FINDINGS: CT HEAD FINDINGS Brain: Ventricular system normal in size and appearance for age. Mild to moderate age related cortical atrophy, mildly progressive since 2015. Mild changes of small vessel disease of the white matter diffusely, unchanged. No midline shift. No acute hemorrhage or hematoma. No extra-axial fluid collections. No evidence of acute infarction.  Approximate 2.6 x 2.8 x 2.3 cm heterogeneous mass with its epicenter in the RIGHT cavernous sinus, partially calcified. No masses elsewhere. Vascular: Severe BILATERAL carotid siphon and mild LEFT vertebral artery atherosclerosis. No hyperdense vessel. Skull: No skull fracture or other focal osseous abnormality involving the skull. Other: None. CT ORBITS FINDINGS Orbits: No orbital fractures. No evidence of orbital hemorrhage or hematoma. No acute inflammatory changes involving the RIGHT orbital fat. Ocular muscles normal in appearance and symmetric bilaterally. Dysconjugate gaze is suspected. Hyperdense RIGHT lens compared to that on the LEFT. Visualized sinuses: Mucous retention cyst or polyp in the LEFT maxillary sinus. Remaining visualized paranasal sinuses, BILATERAL mastoid air cells and BILATERAL middle ear cavities well-aerated. Soft tissues: Unremarkable. IMPRESSION: 1. No acute intracranial abnormality. 2. Mild to moderate age related cortical atrophy and mild chronic microvascular ischemic changes of the white matter diffusely. 3. 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 4. No orbital fractures identified. 5. Hyperdense RIGHT lens compared to that on the LEFT, query cataract. 6. Mild chronic left maxillary sinus disease. MRI of the brain without and with contrast is recommended in further evaluation to better characterize the mass. Electronically Signed   By: Evangeline Dakin M.D.   On: 03/22/2019 12:40    Microbiology: Recent Results (from the past 240 hour(s))  Urine culture     Status: Abnormal   Collection Time: 03/22/19  2:15 PM   Specimen: Urine, Random  Result Value Ref Range Status   Specimen Description URINE, RANDOM  Final   Special Requests   Final    NONE Performed at [facility], [address] [place], [place] [address]     Culture >=100,000 COLONIES/mL KLEBSIELLA PNEUMONIAE (A)  Final   Report Status 03/25/2019 FINAL  Final   Organism ID, Bacteria KLEBSIELLA PNEUMONIAE (A)  Final      Susceptibility   Klebsiella pneumoniae - MIC*    AMPICILLIN >=32 RESISTANT Resistant     CEFAZOLIN <=4 SENSITIVE Sensitive     CEFTRIAXONE <=1 SENSITIVE Sensitive     CIPROFLOXACIN <=0.25 SENSITIVE Sensitive     GENTAMICIN <=1 SENSITIVE Sensitive     IMIPENEM 0.5 SENSITIVE Sensitive     NITROFURANTOIN 64 INTERMEDIATE Intermediate     TRIMETH/SULFA <=20 SENSITIVE Sensitive     AMPICILLIN/SULBACTAM 8 SENSITIVE Sensitive     PIP/TAZO <=4 SENSITIVE Sensitive     Extended ESBL NEGATIVE Sensitive     * >=100,000 COLONIES/mL KLEBSIELLA PNEUMONIAE  SARS Coronavirus 2 (CEPHEID - Performed in [facility] hospital lab), Hosp Order     Status: None   Collection Time: 03/22/19  7:10 PM   Specimen: Nasopharyngeal Swab  Result Value Ref Range Status   SARS Coronavirus 2 NEGATIVE NEGATIVE Final    Comment: (NOTE) If result is NEGATIVE SARS-CoV-2 target nucleic acids are NOT DETECTED. The SARS-CoV-2 RNA is generally detectable in upper and lower  respiratory specimens during the acute phase of infection. The lowest  concentration of SARS-CoV-2 viral copies this assay can detect is 250  copies / mL. A negative result does not preclude SARS-CoV-2 infection  and should not be used as the sole basis for treatment or other  patient management decisions.  A negative result may occur with  improper specimen collection / handling, submission of specimen other  than nasopharyngeal swab, presence of viral mutation(s) within the  areas targeted by this assay, and inadequate number of viral copies  (<250 copies / mL). A negative result must be combined with clinical  observations, patient history, and epidemiological information. If result is POSITIVE SARS-CoV-2 target nucleic acids are DETECTED. The SARS-CoV-2 RNA is generally detectable in  upper and lower  respiratory specimens dur ing the acute phase of infection.  Positive  results are indicative of active infection with SARS-CoV-2.  Clinical  correlation with patient history and other diagnostic information is  necessary to determine patient infection status.  Positive results do  not rule out bacterial infection or co-infection with other viruses. If result is PRESUMPTIVE POSTIVE SARS-CoV-2 nucleic acids MAY BE PRESENT.   A presumptive positive result was obtained on the submitted specimen  and confirmed on repeat testing.  While 2019 novel coronavirus  (SARS-CoV-2) nucleic acids may be present in the submitted sample  additional confirmatory testing may be necessary for epidemiological  and / or clinical management purposes  to differentiate between  SARS-CoV-2 and other Sarbecovirus currently known to infect humans.  If clinically indicated additional testing with an alternate test  methodology [phone]) is advised. The SARS-CoV-2 RNA is generally  detectable in upper and lower respiratory sp ecimens during the acute  phase of infection. The expected result is Negative. Fact Sheet for Patients:  StrictlyIdeas.no Fact Sheet for Healthcare Providers: BankingDealers.co.za This test is not yet approved or cleared by the [place] FDA and has been authorized for detection and/or diagnosis of SARS-CoV-2 by FDA under an Emergency Use Authorization (EUA).  This EUA will remain in effect (meaning this test can be used) for the duration of the COVID-19 declaration under Section 564(b)(1) of the Act, 21 U.S.C. section 360bbb-3(b)(1), unless the authorization is terminated or revoked sooner. Performed at [facility], [place] [address] [place], [place] [address]      Labs: Basic Metabolic Panel: Recent Labs  Lab 03/22/19 1224 03/22/19 1910 03/23/19 0220 03/25/19 0534 03/26/19 0247  NA 131*  --  131* 130* 131*   K 3.9  --  3.5 3.6 3.4*  CL 98  --  101 96* 96*  CO2 22  --  20* 24 27  GLUCOSE 115*  --  115* 105* 103*  BUN 24*  --  17 15 15   CREATININE 1.02* 0.94 0.76 0.93 0.95  CALCIUM 9.1  --  8.5* 8.9 8.9   Liver Function Tests: No results for input(s): AST, ALT, ALKPHOS, BILITOT, PROT, ALBUMIN in the last 168 hours. No results for input(s): LIPASE, AMYLASE in the last 168 hours. No results for input(s): AMMONIA in the last 168 hours. CBC: Recent Labs  Lab 03/22/19 1224 03/22/19 1910 03/25/19 0534  WBC 7.8 6.2 5.9  NEUTROABS 4.9  --   --   HGB 13.7 13.5 12.8  HCT 40.9 40.8 37.8  MCV 87.0 88.1 85.3  PLT 311 271 285   Cardiac Enzymes: No results for input(s): CKTOTAL, CKMB, CKMBINDEX, TROPONINI in the last 168 hours. BNP: BNP (last 3 results) No results for input(s): BNP in the last 8760 hours.  ProBNP (  last 3 results) No results for input(s): PROBNP in the last 8760 hours.  CBG: No results for input(s): GLUCAP in the last 168 hours.     Signed:  Domenic Polite MD.  [facility] 03/26/2019, 10:20 AM

## 2019-03-26 NOTE — TOC Progression Note (Signed)
Transition of Care [facility]) - Progression Note    Patient Details  Name: Sarah Acevedo MRN: [id number] Date of Birth: 03/09/1933  Transition of Care [facility]) CM/SW [place], [place] Phone Number: 03/26/2019, 12:01 PM  Clinical Narrative:    CSW spoke with pt daughter Jenny Reichmann, we discussed decision between SNF and ALF, after consideration pt daughter has decided that she would like for her mom to go to SNF at [facility]). Pt is medically stable for discharge. Await confirmation from [facility] before arranging transport.    Expected Discharge Plan: [place] Barriers to Discharge: Continued Medical Work up, [place] Forensic scientist)  Expected Discharge Plan and Services Expected Discharge Plan: [place] In-house Referral: Clinical Social Work Discharge Planning Services: CM Consult Post Acute Care Choice: [place] arrangements for the past 2 months: [place], [place] Expected Discharge Date: 03/26/19                   Social Determinants of Health (SDOH) Interventions    Readmission Risk Interventions No flowsheet data found.

## 2019-03-26 NOTE — TOC Transition Note (Signed)
Transition of Care [facility]) - CM/SW Discharge Note   Patient Details  Name: Sarah Acevedo MRN: [id number] Date of Birth: March 24, 1933  [facility]) CM/SW Contact:  [place], [place] Phone Number: 03/26/2019, 2:30 PM   Clinical Narrative:    CSW spoke with pt daughter Sarah Acevedo, confirmed time for discharge.  Pt daughter in agreement with [facility] transport at 3:30pm. DNR and controlled prescription on chart signed by MD. All info sent through hub. Level 2 PASRR completed with Jacqualin Combes, QMHP, submitted. Per [place]CMUST waiver still in effect for PASRR.  Initial COVID on 7/18 sufficient per [facility].  Pt birthday was listed incorrectly in Epic, changed to appropriate birthday 07/12/1933.  RN aware of arrangements, [facility] papers on chart.    Final next level of care: [place] Barriers to Discharge: Continued Medical Work up, [place] (PASRR)   Patient Goals and CMS Choice Patient states their goals for this hospitalization and ongoing recovery are:: "for her to get the level of care she needs right now"- pt daughter CMS Medicare.gov Compare Post Acute Care list provided to:: Patient Represenative (must comment)(pt daughter) Choice offered to / list presented to : Adult Children  Discharge Placement PASRR number recieved: (submitted; PASRR level 2 eval done)            Patient chooses bed at: [facility] Patient to be transferred to facility by: [place] Name of family member notified: pt daughter Sarah Acevedo by phone Patient and family notified of of transfer: 03/26/19  Discharge Plan and Services In-house Referral: Clinical Social Work Discharge Planning Services: CM Consult Post Acute Care Choice: [place]                               Social Determinants of Health (SDOH) Interventions     Readmission Risk Interventions No flowsheet data found.

## 2019-10-14 ENCOUNTER — Emergency Department (HOSPITAL_COMMUNITY)
Admission: EM | Admit: 2019-10-14 | Discharge: 2019-10-14 | Disposition: A | Discharge status: Home / Self Care | Payer: Medicare Other | Attending: Emergency Medicine | Admitting: Emergency Medicine

## 2019-10-14 ENCOUNTER — Other Ambulatory Visit: Payer: Self-pay

## 2019-10-14 ENCOUNTER — Emergency Department (HOSPITAL_COMMUNITY): Payer: Medicare Other

## 2019-10-14 DIAGNOSIS — Y999 Unspecified external cause status: Secondary | ICD-10-CM | POA: Insufficient documentation

## 2019-10-14 DIAGNOSIS — Y9301 Activity, walking, marching and hiking: Secondary | ICD-10-CM | POA: Insufficient documentation

## 2019-10-14 DIAGNOSIS — Z7982 Long term (current) use of aspirin: Secondary | ICD-10-CM | POA: Diagnosis not present

## 2019-10-14 DIAGNOSIS — Z87891 Personal history of nicotine dependence: Secondary | ICD-10-CM | POA: Diagnosis not present

## 2019-10-14 DIAGNOSIS — W01198A Fall on same level from slipping, tripping and stumbling with subsequent striking against other object, initial encounter: Secondary | ICD-10-CM | POA: Insufficient documentation

## 2019-10-14 DIAGNOSIS — E039 Hypothyroidism, unspecified: Secondary | ICD-10-CM | POA: Diagnosis not present

## 2019-10-14 DIAGNOSIS — Y92129 Unspecified place in nursing home as the place of occurrence of the external cause: Secondary | ICD-10-CM | POA: Diagnosis not present

## 2019-10-14 DIAGNOSIS — Z79899 Other long term (current) drug therapy: Secondary | ICD-10-CM | POA: Insufficient documentation

## 2019-10-14 DIAGNOSIS — W19XXXA Unspecified fall, initial encounter: Secondary | ICD-10-CM

## 2019-10-14 DIAGNOSIS — S060X0A Concussion without loss of consciousness, initial encounter: Secondary | ICD-10-CM | POA: Insufficient documentation

## 2019-10-14 DIAGNOSIS — G9389 Other specified disorders of brain: Secondary | ICD-10-CM | POA: Diagnosis not present

## 2019-10-14 DIAGNOSIS — I10 Essential (primary) hypertension: Secondary | ICD-10-CM | POA: Insufficient documentation

## 2019-10-14 DIAGNOSIS — S0990XA Unspecified injury of head, initial encounter: Secondary | ICD-10-CM | POA: Diagnosis present

## 2019-10-14 NOTE — ED Notes (Signed)
[facility] has arrived. 

## 2019-10-14 NOTE — Discharge Instructions (Signed)
The CAT scan of the brain again showed a mass that has been present for over a year.  It has increased mildly.  She needs to she her doctor and an MRI can be done if necessary if pt has new symptoms of headaches, trouble walking or other concerns.  No bleeding today or signs of injury.

## 2019-10-14 NOTE — ED Provider Notes (Signed)
[facility] DEPT Provider Note   CSN: [id number] Arrival date & time: 10/14/19  1006     History Chief Complaint  Patient presents with  . Fall    Sarah Acevedo is a 86 y.o. female.  Patient is an 86 year old female with a history of dementia, hypertension, hypothyroidism who is presenting today from her skilled facility after a witnessed fall.  Patient was using her walker and fell backwards hitting her head on the wall and then going down to the floor.  She initially complained of a headache and feeling dizzy and then had one episode of vomiting.  Currently here patient states she feels sore all over but denies any headache or dizziness at this time.  She is reporting she is hungry and wants something to eat.  The history is provided by the patient, the nursing home and the EMS personnel. The history is limited by the absence of a caregiver.  Fall This is a new problem. The current episode started 1 to 2 hours ago. The problem occurs constantly. The problem has been resolved. Associated symptoms include headaches. Associated symptoms comments: Dizziness and nausea after fall backwards and hit her head on the wall.  Vomited after event but denies nausea now and states she is hungry.. Nothing aggravates the symptoms. Nothing relieves the symptoms.       Past Medical History:  Diagnosis Date  . Anxiety   . Basal cell carcinoma   . Bone spur   . Cancer ([place])   . Depression   . Hypertension   . Hypothyroidism   . Ruptured disk     Patient Active Problem List   Diagnosis Date Noted  . UTI (urinary tract infection) 03/23/2019  . AKI (acute kidney injury) ([place]) 03/22/2019  . Hypothyroidism 03/22/2019  . Failure to thrive in adult 03/22/2019  . 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 Essential hypertension 03/22/2019  . Depression 03/22/2019  . Brief psychotic disorder ([place])   . 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 CESAREAN SECTION    . TMJ ARTHROPLASTY    . TONSILLECTOMY       OB History   No obstetric history on file.     No family history on file.  Social History   Tobacco Use  . Smoking status: Former Research scientist (life sciences)  . Smokeless tobacco: Never Used  Substance Use Topics  . Alcohol use: No  . Drug use: No    Home Medications Prior to Admission medications   Medication Sig Start Date End Date Taking? Authorizing Provider  ALPRAZolam (XANAX) 0.25 MG tablet Take 1 tablet (0.25 mg total) by mouth 2 (two) times daily as needed for anxiety. 03/26/19  Yes Domenic Polite, MD  aspirin 325 MG tablet Take 325 mg by mouth daily.   Yes [provider]  citalopram (CELEXA) 20 MG tablet Take 20 mg by mouth daily.   Yes [provider]  levothyroxine (SYNTHROID, LEVOTHROID) 50 MCG tablet Take 50 mcg by mouth daily before breakfast.    Yes [provider]  Melatonin 3 MG TABS Take 6 mg by mouth at bedtime.    Yes [provider]  ferrous sulfate 325 (65 FE) MG tablet Take 325 mg by mouth 2 (two) times daily with a meal.    [provider]  hydrOXYzine (ATARAX/VISTARIL) 10 MG tablet Take 10 mg by mouth every 8 (eight) hours as needed for anxiety.  [provider]  metoprolol (LOPRESSOR) 100 MG tablet Take 100 mg by mouth every morning.     [provider]  omeprazole (PRILOSEC) 20 MG capsule Take 20 mg by mouth daily.    [provider]  traZODone (DESYREL) 50 MG tablet Take 50 mg by mouth at bedtime.    [provider]    Allergies    Contrast media [iodinated diagnostic agents] and Iodine  Review of Systems   Review of Systems  Neurological: Positive for headaches.  All other systems reviewed and are negative.   Physical Exam Updated Vital Signs BP 135/63 (BP Location: Left Arm)   Pulse (!) 59   Temp 98 F (36.7 C) (Oral)   Resp 20   SpO2 98%   Physical  Exam Vitals and nursing note reviewed.  Constitutional:      General: She is not in acute distress.    Appearance: She is well-developed and normal weight.  HENT:     Head: Normocephalic and atraumatic.     Comments: No large contusions or lacerations to the head Eyes:     Pupils: Pupils are equal, round, and reactive to light.  Neck:     Comments: C-collar in place but no central neck tenderness Cardiovascular:     Rate and Rhythm: Normal rate and regular rhythm.     Heart sounds: Normal heart sounds. No murmur. No friction rub.  Pulmonary:     Effort: Pulmonary effort is normal.     Breath sounds: Normal breath sounds. No wheezing or rales.  Abdominal:     General: Bowel sounds are normal. There is no distension.     Palpations: Abdomen is soft.     Tenderness: There is no abdominal tenderness. There is no guarding or rebound.  Musculoskeletal:        General: No tenderness. Normal range of motion.     Right lower leg: No edema.     Left lower leg: No edema.     Comments: No edema.  Able to range bilateral shoulders, hips, knees and ankles without tenderness.  No thoracic or lumbar tenderness  Skin:    General: Skin is warm and dry.     Findings: No rash.  Neurological:     Mental Status: She is alert.     Cranial Nerves: No cranial nerve deficit.     Sensory: No sensory deficit.     Motor: No weakness.     Comments: Oriented to self  Psychiatric:        Behavior: Behavior normal.     ED Results / Procedures / Treatments   Labs (all labs ordered are listed, but only abnormal results are displayed) Labs Reviewed - No data to display  EKG None  Radiology CT Head Wo Contrast  Result Date: 10/14/2019 CLINICAL DATA:  The patient fell and hit the back of her head on the wall, falling to the floor. After she fell, she vomited. Headache, dizziness and nausea. History of dementia. EXAM: CT HEAD WITHOUT CONTRAST CT CERVICAL SPINE WITHOUT CONTRAST TECHNIQUE: Multidetector CT  imaging of the head and cervical spine was performed following the standard protocol without intravenous contrast. Multiplanar CT image reconstructions of the cervical spine were also generated. COMPARISON:  Head and orbits CT dated 03/22/2019 FINDINGS: CT HEAD FINDINGS Brain: Today's images are limited by patient motion. A rounded, heterogeneous mass centered at the level of the cavernous sinus on the right is better visualized and more heterogeneous today with interval  central low density. This measures approximately 2.6 x 2.4 x 2.2 cm in maximum dimensions, previously approximately 2.8 x 2.6 x 2.3 cm. The ventricles remain normal in size and position. Stable mild diffuse enlargement of the subarachnoid spaces. Moderate patchy white matter low density in both cerebral hemispheres with progression. No gross intracranial hemorrhage or CT evidence of acute infarction. Vascular: No hyperdense vessel or unexpected calcification. Skull: No visible skull fracture. Sinuses/Orbits: Status post cataract extraction on the left. Stable dysconjugate gaze. Unremarkable paranasal sinuses. Other: None. CT CERVICAL SPINE FINDINGS Alignment: Mild reversal of the normal lordosis in the lower cervical spine. No subluxations. Skull base and vertebrae: No acute fracture. No primary bone lesion or focal pathologic process. Soft tissues and spinal canal: No prevertebral fluid or swelling. No visible canal hematoma. Disc levels: Multilevel degenerative changes, most pronounced at the C5-6 level. The C6 and C7 vertebral bodies are fused. Upper chest: Clear lung apices. Other: Dense bilateral carotid artery calcifications. IMPRESSION: 1. No skull fracture or intracranial hemorrhage. 2. No cervical spine fracture or subluxation. 3. Interval mild increase in size of a heterogeneous mass centered at the level of the cavernous sinus on the right, most likely representing a meningioma or cavernous carotid artery aneurysm. Again, this could be  better characterized with pre and post contrast MRI of the brain. 4. Stable mild diffuse cerebral and cerebellar atrophy. 5. Moderate chronic small vessel white matter ischemic changes in both cerebral hemispheres with significant progression. 6. Multilevel cervical spine degenerative changes. 7. Dense bilateral carotid artery atheromatous calcifications. Electronically Signed   By: Claudie Revering M.D.   On: 10/14/2019 13:19   CT Cervical Spine Wo Contrast  Result Date: 10/14/2019 CLINICAL DATA:  The patient fell and hit the back of her head on the wall, falling to the floor. After she fell, she vomited. Headache, dizziness and nausea. History of dementia. EXAM: CT HEAD WITHOUT CONTRAST CT CERVICAL SPINE WITHOUT CONTRAST TECHNIQUE: Multidetector CT imaging of the head and cervical spine was performed following the standard protocol without intravenous contrast. Multiplanar CT image reconstructions of the cervical spine were also generated. COMPARISON:  Head and orbits CT dated 03/22/2019 FINDINGS: CT HEAD FINDINGS Brain: Today's images are limited by patient motion. A rounded, heterogeneous mass centered at the level of the cavernous sinus on the right is better visualized and more heterogeneous today with interval central low density. This measures approximately 2.6 x 2.4 x 2.2 cm in maximum dimensions, previously approximately 2.8 x 2.6 x 2.3 cm. The ventricles remain normal in size and position. Stable mild diffuse enlargement of the subarachnoid spaces. Moderate patchy white matter low density in both cerebral hemispheres with progression. No gross intracranial hemorrhage or CT evidence of acute infarction. Vascular: No hyperdense vessel or unexpected calcification. Skull: No visible skull fracture. Sinuses/Orbits: Status post cataract extraction on the left. Stable dysconjugate gaze. Unremarkable paranasal sinuses. Other: None. CT CERVICAL SPINE FINDINGS Alignment: Mild reversal of the normal lordosis in the  lower cervical spine. No subluxations. Skull base and vertebrae: No acute fracture. No primary bone lesion or focal pathologic process. Soft tissues and spinal canal: No prevertebral fluid or swelling. No visible canal hematoma. Disc levels: Multilevel degenerative changes, most pronounced at the C5-6 level. The C6 and C7 vertebral bodies are fused. Upper chest: Clear lung apices. Other: Dense bilateral carotid artery calcifications. IMPRESSION: 1. No skull fracture or intracranial hemorrhage. 2. No cervical spine fracture or subluxation. 3. Interval mild increase in size of a heterogeneous mass centered at  the level of the cavernous sinus on the right, most likely representing a meningioma or cavernous carotid artery aneurysm. Again, this could be better characterized with pre and post contrast MRI of the brain. 4. Stable mild diffuse cerebral and cerebellar atrophy. 5. Moderate chronic small vessel white matter ischemic changes in both cerebral hemispheres with significant progression. 6. Multilevel cervical spine degenerative changes. 7. Dense bilateral carotid artery atheromatous calcifications. Electronically Signed   By: Claudie Revering M.D.   On: 10/14/2019 13:19    Procedures Procedures (including critical care time)  Medications Ordered in ED Medications - No data to display  ED Course  I have reviewed the triage vital signs and the nursing notes.  Pertinent labs & imaging results that were available during my care of the patient were reviewed by me and considered in my medical decision making (see chart for details).    MDM Rules/Calculators/A&P                      Elderly female with a fall backwards today while walking with her walker.  Did sustain a head injury with vomiting afterwards.  Currently she is awake and alert.  She denies any headache or dizziness at this time.  Able to range all extremities without any pain.  Patient does take aspirin but no other anticoagulation.  Head and  cervical films are pending.\  1:56 PM CT with no signs of acute injury.  Again noted a mass in the cavernous sinus which was seen on prior imaging with mild increase.  No bleeding or edema at this time.  Pt can have outpt MRI if deemed necessary.  Pt has had no further vomiting here and appears comfortable.  c-spine cleared.  Pt safe for d/c back to facility.  Final Clinical Impression(s) / ED Diagnoses Final diagnoses:  Fall, initial encounter  Concussion without loss of consciousness, initial encounter  Brain mass    Rx / DC Orders ED Discharge Orders    None       Blanchie Dessert, MD 10/14/19 1401

## 2019-10-14 NOTE — ED Triage Notes (Signed)
Pt brought in by EMS from [facility] after experiencing a fall. EMS reports that staff states pt was standing up and fell backwards hitting her head on the wall and falling to the floor. After pt fell, she vomited. Per EMS, pt ambulates with a walker and pt ambulated to stretcher prior to arrival. Pt complains of headache, dizziness, and nausea. Pt received 4mg  of zofran prior to arrival. Pt has a hx of dementia.

## 2019-10-14 NOTE — ED Notes (Signed)
Pt found standing at the foot of the bed. Pt easily redirectable, returned pt to bed and covered with blanket. Door propped open for easier monitoring

## 2019-10-14 NOTE — ED Notes (Signed)
[facility] called  

## 2019-12-06 ENCOUNTER — Emergency Department (HOSPITAL_COMMUNITY)
Admission: EM | Admit: 2019-12-06 | Discharge: 2019-12-06 | Disposition: A | Discharge status: Home / Self Care | Payer: Medicare Other | Attending: Emergency Medicine | Admitting: Emergency Medicine

## 2019-12-06 ENCOUNTER — Other Ambulatory Visit: Payer: Self-pay

## 2019-12-06 DIAGNOSIS — L292 Pruritus vulvae: Secondary | ICD-10-CM | POA: Diagnosis present

## 2019-12-06 DIAGNOSIS — Z87891 Personal history of nicotine dependence: Secondary | ICD-10-CM | POA: Insufficient documentation

## 2019-12-06 DIAGNOSIS — Z7982 Long term (current) use of aspirin: Secondary | ICD-10-CM | POA: Insufficient documentation

## 2019-12-06 DIAGNOSIS — K649 Unspecified hemorrhoids: Secondary | ICD-10-CM | POA: Insufficient documentation

## 2019-12-06 DIAGNOSIS — K5641 Fecal impaction: Secondary | ICD-10-CM | POA: Diagnosis not present

## 2019-12-06 DIAGNOSIS — I10 Essential (primary) hypertension: Secondary | ICD-10-CM | POA: Diagnosis not present

## 2019-12-06 DIAGNOSIS — E039 Hypothyroidism, unspecified: Secondary | ICD-10-CM | POA: Diagnosis not present

## 2019-12-06 DIAGNOSIS — Z79899 Other long term (current) drug therapy: Secondary | ICD-10-CM | POA: Insufficient documentation

## 2019-12-06 LAB — URINALYSIS, ROUTINE W REFLEX MICROSCOPIC
Bilirubin Urine: NEGATIVE
Glucose, UA: NEGATIVE mg/dL
Hgb urine dipstick: NEGATIVE
Ketones, ur: NEGATIVE mg/dL
Leukocytes,Ua: NEGATIVE
Nitrite: NEGATIVE
Protein, ur: NEGATIVE mg/dL
Specific Gravity, Urine: 1.013 (ref 1.005–1.030)
pH: 6 (ref 5.0–8.0)

## 2019-12-06 MED ORDER — POLYETHYLENE GLYCOL 3350 17 GM/SCOOP PO POWD: 1.0000 | Freq: Once | ORAL | 0 refills | Status: DC

## 2019-12-06 MED ORDER — HYDROCORTISONE (PERIANAL) 2.5 % EX CREA: 1.0000 "application " | TOPICAL_CREAM | Freq: Two times a day (BID) | CUTANEOUS | 0 refills | Status: DC

## 2019-12-06 MED ORDER — LIDOCAINE HCL URETHRAL/MUCOSAL 2 % EX GEL
1.0000 "application " | Freq: Once | CUTANEOUS | Status: AC
  Administered 2019-12-06: 1 via TOPICAL
  Filled 2019-12-06: qty 30

## 2019-12-06 MED ORDER — POLYETHYLENE GLYCOL 3350 17 GM/SCOOP PO POWD: 1.0000 | Freq: Once | ORAL | 0 refills | Status: AC

## 2019-12-06 NOTE — ED Notes (Signed)
Daughter at bedside, DC instructions, prescriptions and follow-up explained, verbalized understanding. Daughter to take pt back to [facility], [facility] transport cancelled.

## 2019-12-06 NOTE — Discharge Instructions (Signed)
Today urine was normal.  Did have impacted stool and hemorrhoids.  Use the cream and keep area clean.

## 2019-12-06 NOTE — ED Provider Notes (Signed)
[facility] DEPT Provider Note   CSN: [id number] Arrival date & time: 12/06/19  [id number]     History Chief Complaint  Patient presents with  . genital discomfort    Sarah Acevedo is a 86 y.o. female.  Patient is an 86 year old female with a history of hypertension, psychosis, depression and hypothyroidism presented from her living facility today with complaints of itching and grabbing in her genital area.  They report she has a history of UTIs but this is not a usual behavior for her.  Patient is awake and alert on exam.  She is slightly moaning and points to her rectal area.  She is not able to give any further history at this time.  There was no report of fever, vomiting, abdominal pain.  No change in oral intake.  The history is provided by the patient and the nursing home.       Past Medical History:  Diagnosis Date  . Anxiety   . Basal cell carcinoma   . Bone spur   . Cancer ([place])   . Depression   . Hypertension   . Hypothyroidism   . Ruptured disk     Patient Active Problem List   Diagnosis Date Noted  . UTI (urinary tract infection) 03/23/2019  . AKI (acute kidney injury) ([place]) 03/22/2019  . Hypothyroidism 03/22/2019  . Failure to thrive in adult 03/22/2019  . 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 Essential hypertension 03/22/2019  . Depression 03/22/2019  . Brief psychotic disorder ([place])   . 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 CESAREAN SECTION    . TMJ ARTHROPLASTY    . TONSILLECTOMY       OB History   No obstetric history on file.     No family history on file.  Social History   Tobacco Use  . Smoking status: Former Research scientist (life sciences)  . Smokeless tobacco: Never Used  Substance Use Topics  . Alcohol use: No  . Drug use: No    Home Medications Prior to Admission medications   Medication Sig Start Date End Date Taking? Authorizing Provider    acetaminophen (TYLENOL) 325 MG tablet Take 650 mg by mouth every 6 (six) hours as needed (For pain.).    [provider]  ALPRAZolam Duanne Moron) 0.25 MG tablet Take 1 tablet (0.25 mg total) by mouth 2 (two) times daily as needed for anxiety. 03/26/19   Domenic Polite, MD  aspirin 325 MG tablet Take 325 mg by mouth daily.    [provider]  busPIRone (BUSPAR) 10 MG tablet Take 10 mg by mouth 2 (two) times daily.    [provider]  cetirizine (ZYRTEC) 10 MG tablet Take 10 mg by mouth daily as needed for allergies.    [provider]  citalopram (CELEXA) 20 MG tablet Take 20 mg by mouth daily.    [provider]  ferrous sulfate 325 (65 FE) MG tablet Take 325 mg by mouth 2 (two) times daily with a meal.    [provider]  hydrOXYzine (ATARAX/VISTARIL) 10 MG tablet Take 10 mg by mouth every 8 (eight) hours as needed for anxiety.    [provider]  ibuprofen (ADVIL) 400 MG tablet Take 400 mg by mouth every 8 (eight) hours as needed for headache or mild pain.    [provider]  levothyroxine (SYNTHROID, LEVOTHROID) 50 MCG tablet Take 50 mcg by mouth daily before breakfast.  [provider]  loperamide (IMODIUM A-D) 2 MG tablet Take 2 mg by mouth every 6 (six) hours as needed for diarrhea or loose stools.    [provider]  Melatonin 3 MG TABS Take 6 mg by mouth at bedtime.     [provider]  metoprolol (LOPRESSOR) 100 MG tablet Take 100 mg by mouth every morning.     [provider]  omeprazole (PRILOSEC) 20 MG capsule Take 20 mg by mouth daily.    [provider]  traMADol (ULTRAM) 50 MG tablet Take by mouth every 12 (twelve) hours as needed (For pain.).    [provider]  traZODone (DESYREL) 50 MG tablet Take 50 mg by mouth at bedtime.    [provider]  zinc sulfate 220 (50 Zn) MG capsule Take 220 mg by mouth daily.    [provider]    Allergies     Contrast media [iodinated diagnostic agents] and Iodine  Review of Systems   Review of Systems  Unable to perform ROS: Dementia    Physical Exam Updated Vital Signs Pulse 72   Temp 98.5 F (36.9 C) (Oral)   Ht 5\' 5"  (1.651 m)   Wt 59.4 kg   SpO2 100%   BMI 21.80 kg/m   Physical Exam Vitals and nursing note reviewed.  Constitutional:      Appearance: Normal appearance. She is normal weight.     Comments: Slightly agitated on exam  HENT:     Head: Normocephalic.     Nose: Nose normal.     Mouth/Throat:     Mouth: Mucous membranes are moist.  Eyes:     Extraocular Movements: Extraocular movements intact.     Pupils: Pupils are equal, round, and reactive to light.  Cardiovascular:     Rate and Rhythm: Normal rate and regular rhythm.     Pulses: Normal pulses.  Pulmonary:     Effort: Pulmonary effort is normal.     Breath sounds: Normal breath sounds.  Abdominal:     General: Abdomen is flat. Bowel sounds are normal.     Palpations: Abdomen is soft.     Tenderness: There is no abdominal tenderness.  Genitourinary:    Comments: Vaginal structures appear normal however mild contamination with stool.  Rectum is erythematous, tender to palpation with notable hemorrhoids and fecal impaction Musculoskeletal:        General: No tenderness.     Cervical back: Normal range of motion and neck supple.     Right lower leg: No edema.     Left lower leg: No edema.  Skin:    General: Skin is warm and dry.  Neurological:     General: No focal deficit present.     Mental Status: She is alert.     Comments: Non-verbal  Psychiatric:     Comments: Calm and cooperative     ED Results / Procedures / Treatments   Labs (all labs ordered are listed, but only abnormal results are displayed) Labs Reviewed  URINALYSIS, ROUTINE W REFLEX MICROSCOPIC    EKG None  Radiology No results found.  Procedures Procedures (including critical care time)  Medications Ordered in  ED Medications  lidocaine (XYLOCAINE) 2 % jelly 1 application (has no administration in time range)    ED Course  I have reviewed the triage vital signs and the nursing notes.  Pertinent labs & imaging results that were available during my care of the patient were reviewed by  me and considered in my medical decision making (see chart for details).    MDM Rules/Calculators/A&P                      Elderly female coming from a living facility due to concern of discomfort in her genital area.  Patient is unable to give further history.  Vaginal area appears normal but is contaminated with stool.  Patient does have history of UTIs and will ensure she does not have an urinary tract infection.  However patient also has external hemorrhoids, erythema and tenderness in her rectal area with a fecal impaction.  Stool was removed with traces of blood present.  Lidocaine jelly was applied.  I ordered a UA for further evaluation.  Vital signs are reassuring and patient is otherwise well-appearing.  No suspicion of rectal prolapse or perirectal abscess at this time.  11:11 AM The UA was ordered by me and interpreted and normal.  On re-eval and repeat rectal exam pt no longer having impaction.  Pt does have inflammation in the rectal areas but no masses present.  Will start on miralax and anusol for her hemorroids.  Pt will need f/u with PCP at the facility.  Final Clinical Impression(s) / ED Diagnoses Final diagnoses:  Fecal impaction in rectum (HCC)  Acute hemorrhoid    Rx / DC Orders ED Discharge Orders         Ordered    hydrocortisone (ANUSOL-HC) 2.5 % rectal cream  2 times daily     12/06/19 1116    polyethylene glycol powder (GLYCOLAX/MIRALAX) 17 GM/SCOOP powder   Once     12/06/19 1116           Blanchie Dessert, MD 12/06/19 1116

## 2019-12-06 NOTE — ED Triage Notes (Signed)
Arrives via EMS from [facility]. C/C rectal and genital pain. Per EMS patient grabbing at genital area, reports history of UTI. Alert and oriented x2, at baseline. RR 20. HX of dementia.

## 2020-11-30 ENCOUNTER — Encounter (HOSPITAL_COMMUNITY): Payer: Self-pay

## 2020-11-30 ENCOUNTER — Other Ambulatory Visit: Payer: Self-pay

## 2020-11-30 ENCOUNTER — Emergency Department (HOSPITAL_COMMUNITY): Payer: PRIVATE HEALTH INSURANCE

## 2020-11-30 ENCOUNTER — Emergency Department (HOSPITAL_COMMUNITY)
Admission: EM | Admit: 2020-11-30 | Discharge: 2020-11-30 | Disposition: A | Discharge status: Home / Self Care | Payer: PRIVATE HEALTH INSURANCE | Attending: Emergency Medicine | Admitting: Emergency Medicine

## 2020-11-30 DIAGNOSIS — N390 Urinary tract infection, site not specified: Secondary | ICD-10-CM | POA: Diagnosis not present

## 2020-11-30 DIAGNOSIS — Z20822 Contact with and (suspected) exposure to covid-19: Secondary | ICD-10-CM | POA: Diagnosis not present

## 2020-11-30 DIAGNOSIS — N179 Acute kidney failure, unspecified: Secondary | ICD-10-CM

## 2020-11-30 DIAGNOSIS — I10 Essential (primary) hypertension: Secondary | ICD-10-CM | POA: Insufficient documentation

## 2020-11-30 DIAGNOSIS — Z87891 Personal history of nicotine dependence: Secondary | ICD-10-CM | POA: Diagnosis not present

## 2020-11-30 DIAGNOSIS — Z85828 Personal history of other malignant neoplasm of skin: Secondary | ICD-10-CM | POA: Diagnosis not present

## 2020-11-30 DIAGNOSIS — Z7982 Long term (current) use of aspirin: Secondary | ICD-10-CM | POA: Diagnosis not present

## 2020-11-30 DIAGNOSIS — E039 Hypothyroidism, unspecified: Secondary | ICD-10-CM | POA: Diagnosis not present

## 2020-11-30 DIAGNOSIS — F0390 Unspecified dementia without behavioral disturbance: Secondary | ICD-10-CM | POA: Insufficient documentation

## 2020-11-30 DIAGNOSIS — Z79899 Other long term (current) drug therapy: Secondary | ICD-10-CM | POA: Diagnosis not present

## 2020-11-30 DIAGNOSIS — R0602 Shortness of breath: Secondary | ICD-10-CM

## 2020-11-30 LAB — CBC WITH DIFFERENTIAL/PLATELET
Abs Immature Granulocytes: 0.04 10*3/uL (ref 0.00–0.07)
Basophils Absolute: 0.1 10*3/uL (ref 0.0–0.1)
Basophils Relative: 1 %
Eosinophils Absolute: 0.2 10*3/uL (ref 0.0–0.5)
Eosinophils Relative: 3 %
HCT: 40.9 % (ref 36.0–46.0)
Hemoglobin: 12.4 g/dL (ref 12.0–15.0)
Immature Granulocytes: 1 %
Lymphocytes Relative: 20 %
Lymphs Abs: 1.5 10*3/uL (ref 0.7–4.0)
MCH: 26.8 pg (ref 26.0–34.0)
MCHC: 30.3 g/dL (ref 30.0–36.0)
MCV: 88.5 fL (ref 80.0–100.0)
Monocytes Absolute: 0.7 10*3/uL (ref 0.1–1.0)
Monocytes Relative: 10 %
Neutro Abs: 4.8 10*3/uL (ref 1.7–7.7)
Neutrophils Relative %: 65 %
Platelets: 284 10*3/uL (ref 150–400)
RBC: 4.62 MIL/uL (ref 3.87–5.11)
RDW: 15.1 % (ref 11.5–15.5)
WBC: 7.4 10*3/uL (ref 4.0–10.5)
nRBC: 0 % (ref 0.0–0.2)

## 2020-11-30 LAB — URINALYSIS, ROUTINE W REFLEX MICROSCOPIC
Bilirubin Urine: NEGATIVE
Glucose, UA: NEGATIVE mg/dL
Hgb urine dipstick: NEGATIVE
Ketones, ur: NEGATIVE mg/dL
Nitrite: POSITIVE — AB
Protein, ur: NEGATIVE mg/dL
Specific Gravity, Urine: 1.015 (ref 1.005–1.030)
pH: 7 (ref 5.0–8.0)

## 2020-11-30 LAB — RESP PANEL BY RT-PCR (FLU A&B, COVID) ARPGX2
Influenza A by PCR: NEGATIVE
Influenza B by PCR: NEGATIVE
SARS Coronavirus 2 by RT PCR: NEGATIVE

## 2020-11-30 LAB — COMPREHENSIVE METABOLIC PANEL
ALT: 20 U/L (ref 0–44)
AST: 22 U/L (ref 15–41)
Albumin: 3.4 g/dL — ABNORMAL LOW (ref 3.5–5.0)
Alkaline Phosphatase: 215 U/L — ABNORMAL HIGH (ref 38–126)
Anion gap: 8 (ref 5–15)
BUN: 28 mg/dL — ABNORMAL HIGH (ref 8–23)
CO2: 25 mmol/L (ref 22–32)
Calcium: 8.8 mg/dL — ABNORMAL LOW (ref 8.9–10.3)
Chloride: 103 mmol/L (ref 98–111)
Creatinine, Ser: 1.2 mg/dL — ABNORMAL HIGH (ref 0.44–1.00)
GFR, Estimated: 44 mL/min — ABNORMAL LOW (ref >60)
Glucose, Bld: 101 mg/dL — ABNORMAL HIGH (ref 70–99)
Potassium: 4.6 mmol/L (ref 3.5–5.1)
Sodium: 136 mmol/L (ref 135–145)
Total Bilirubin: 0.6 mg/dL (ref 0.3–1.2)
Total Protein: 6.5 g/dL (ref 6.5–8.1)

## 2020-11-30 LAB — BRAIN NATRIURETIC PEPTIDE: B Natriuretic Peptide: 60.6 pg/mL (ref 0.0–100.0)

## 2020-11-30 LAB — TROPONIN I (HIGH SENSITIVITY)
Troponin I (High Sensitivity): 3 ng/L (ref <18)
Troponin I (High Sensitivity): 3 ng/L (ref <18)

## 2020-11-30 MED ORDER — SULFAMETHOXAZOLE-TRIMETHOPRIM 800-160 MG PO TABS
1.0000 | ORAL_TABLET | Freq: Once | ORAL | Status: AC
  Administered 2020-11-30: 1 via ORAL
  Filled 2020-11-30: qty 1

## 2020-11-30 MED ORDER — SULFAMETHOXAZOLE-TRIMETHOPRIM 800-160 MG PO TABS: 1.0000 | ORAL_TABLET | Freq: Two times a day (BID) | ORAL | 0 refills | Status: AC

## 2020-11-30 MED ORDER — SODIUM CHLORIDE 0.9 % IV BOLUS
500.0000 mL | Freq: Once | INTRAVENOUS | Status: AC
  Administered 2020-11-30: 500 mL via INTRAVENOUS

## 2020-11-30 NOTE — ED Provider Notes (Signed)
I provided a substantive portion of the care of this patient.  I personally performed the entirety of the history for this encounter.  EKG Interpretation  Date/Time:  Tuesday November 30 2020 07:44:15 EDT Ventricular Rate:  49 PR Interval:  195 QRS Duration: 96 QT Interval:  462 QTC Calculation: 418 R Axis:   84 Text Interpretation: Sinus bradycardia Borderline right axis deviation no change from previoius Confirmed by Charlesetta Shanks [phone]) on 11/30/2020 8:07:52 AM  Patient reports that she awakened short of breath this morning.  She reports that is not typical for her.  She reports she felt like she could not catch her breath.  She denies any associated chest pain.  She called for assistance.  Ultimately EMS was called.  She reports she feels a little better now.  She is not sure what they did that made her feel any better.  Patient reports at baseline she uses her walker independently to walk to get her meals and does not feel significantly winded with that amount of exertion.  She does however report she has some baseline shortness of breath at all times.  She reports yesterday was a normal day.  She was not having chest pain, shortness of breath, fever, chills, body aches.  She endorses just a very minor amount of coughing persistently.  Patient has very distant smoking history.  No COPD or asthma that she is aware of.  Patient is alert and nontoxic.  Mental status is clear.  Airway patent.  Mucous membranes pink and moist.  Mild tachypnea.  Heart regular no gross rub murmur gallop.  Some fine crackle at the right lung base.  Lungs otherwise clear.  Abdomen soft and nondistended.  No peripheral edema.  Calf soft nontender.  At this time we will proceed for diagnostic evaluation for anginal equivalent, CHF, pneumonia, more distant consideration for possible PE.  Patient is stable at this time with normal vital signs.  I agree with plan and management   Charlesetta Shanks, MD 11/30/20 [phone]

## 2020-11-30 NOTE — ED Triage Notes (Signed)
PT BIB EMS for [facility] reported by staff. Pt is alert to self and knows she is in the hospital for [facility]. Pt is tachypneic 22-26 respirations. Breath sounds clear.

## 2020-11-30 NOTE — Discharge Instructions (Addendum)
Patient was noted to have AKI on lab work today.  Please have patient see her primary care provider to have this lab work reevaluated.  Please ensure patient is receiving adequate hydration.  Patient's lab work, chest x-ray and physical exam showed no acute cause for her shortness of breath today.  Patient shortness of breath resolved.  Patient was found to have urinary tract infection.  She was started on Bactrim.  Please have her take 1 pill twice a day for the next 7 days.

## 2020-11-30 NOTE — ED Provider Notes (Signed)
[facility] EMERGENCY DEPARTMENT Provider Note   CSN: [id number] Arrival date & time: 11/30/20  0740     History Chief Complaint  Patient presents with  . Shortness of Breath    Sarah Acevedo is a 87 y.o. female with a history of dementia, hypothyroidism, hypertension, depression.  Patient is a resident of [facility].  patient was found to be alert to person and knows that she is in the hospital but cannot say where and what city she is in.  HPI limited due to patient's dementia.  Patient presents with chief complaint of shortness of breath.  Patient reports that she woke up this morning and had " trouble breathing."  Patient reports that her shortness of breath was worse when she sat up in bed.  Present patient reports that her shortness of breath has improved.  Patient denies any pain or discomfort.  Patient's daughter arrived at bedside during emergency department stay.  She reports that patient has a history of shortness of breath.  Reports she has at least one episode on a weekly basis.  States that these episodes are normally due to anxiety.    Spoke with representative Laveda Norman from [facility].  She reports that shift signout on patient reported that she seemed out of breath this morning and was telling staff to call EMS.  Representative was not on hand during this event and has no further information.  HPI     Past Medical History:  Diagnosis Date  . Anxiety   . Basal cell carcinoma   . Bone spur   . Cancer ([place])   . Depression   . Hypertension   . Hypothyroidism   . Ruptured disk     Patient Active Problem List   Diagnosis Date Noted  . UTI (urinary tract infection) 03/23/2019  . AKI (acute kidney injury) ([place]) 03/22/2019  . Hypothyroidism 03/22/2019  . Failure to thrive in adult 03/22/2019  . 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 Essential hypertension 03/22/2019  . Depression 03/22/2019  . Brief psychotic disorder ([place])   .  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 CESAREAN SECTION    . TMJ ARTHROPLASTY    . TONSILLECTOMY       OB History   No obstetric history on file.     No family history on file.  Social History   Tobacco Use  . Smoking status: Former Research scientist (life sciences)  . Smokeless tobacco: Never Used  Substance Use Topics  . Alcohol use: No  . Drug use: No    Home Medications Prior to Admission medications   Medication Sig Start Date End Date Taking? Authorizing Provider  acetaminophen (TYLENOL) 325 MG tablet Take 650 mg by mouth every 6 (six) hours as needed (For pain.).    [provider]  ALPRAZolam Duanne Moron) 0.25 MG tablet Take 1 tablet (0.25 mg total) by mouth 2 (two) times daily as needed for anxiety. 03/26/19   Domenic Polite, MD  aspirin 325 MG tablet Take 325 mg by mouth daily.    [provider]  busPIRone (BUSPAR) 10 MG tablet Take 10 mg by mouth 2 (two) times daily.    [provider]  cetirizine (ZYRTEC) 10 MG tablet Take 10 mg by mouth daily as needed for allergies.    [provider]  citalopram (CELEXA) 10 MG tablet Take 10 mg by mouth daily.    [provider]  citalopram (CELEXA) 20 MG tablet Take 20 mg by mouth daily.    [provider]  docusate sodium (COLACE) 100 MG capsule Take 100 mg by mouth daily.    [provider]  famotidine (PEPCID) 20 MG tablet Take 20 mg by mouth daily.    [provider]  hydrocortisone (ANUSOL-HC) 2.5 % rectal cream Place 1 application rectally 2 (two) times daily. 12/06/19   Blanchie Dessert, MD  hydrOXYzine (ATARAX/VISTARIL) 10 MG tablet Take 10 mg by mouth every 8 (eight) hours as needed for anxiety.    [provider]  ibuprofen (ADVIL) 400 MG tablet Take 400 mg by mouth every 8 (eight) hours as needed for headache or mild pain.    [provider]  levothyroxine (SYNTHROID,  LEVOTHROID) 50 MCG tablet Take 50 mcg by mouth daily before breakfast.     [provider]  loperamide (IMODIUM A-D) 2 MG tablet Take 2 mg by mouth every 6 (six) hours as needed for diarrhea or loose stools.    [provider]  magnesium citrate SOLN Take 1 Bottle by mouth daily as needed for severe constipation.    [provider]  Melatonin 3 MG TABS Take 6 mg by mouth at bedtime.     [provider]  metoprolol (LOPRESSOR) 100 MG tablet Take 100 mg by mouth every morning.     [provider]  omeprazole (PRILOSEC) 20 MG capsule Take 20 mg by mouth daily.    [provider]  ondansetron (ZOFRAN) 8 MG tablet Take 8 mg by mouth 2 (two) times daily.    [provider]  traMADol (ULTRAM) 50 MG tablet Take by mouth every 12 (twelve) hours as needed (For pain.).    [provider]  traZODone (DESYREL) 50 MG tablet Take 50 mg by mouth at bedtime.    [provider]  vortioxetine HBr (TRINTELLIX) 10 MG TABS tablet Take 10 mg by mouth daily.    [provider]  vortioxetine HBr (TRINTELLIX) 5 MG TABS tablet Take 5 mg by mouth daily.    [provider]  zinc sulfate 220 (50 Zn) MG capsule Take 220 mg by mouth daily.    [provider]    Allergies    Contrast media [iodinated diagnostic agents] and Iodine  Review of Systems   Review of Systems  Unable to perform ROS: Dementia    Physical Exam Updated Vital Signs BP 140/80   Pulse (!) 49   Temp 98.7 F (37.1 C) (Oral)   Resp (!) 28   SpO2 100%   Physical Exam Vitals and nursing note reviewed.  Constitutional:      General: She is not in acute distress.    Appearance: She is not ill-appearing, toxic-appearing or diaphoretic.  Eyes:     General: No scleral icterus.       Right eye: No discharge.        Left eye: No discharge.     Extraocular Movements:     Right eye: Abnormal extraocular motion present. No nystagmus.     Left  eye: Normal extraocular motion and no nystagmus.     Conjunctiva/sclera:     Right eye: Right conjunctiva is not injected. No chemosis, exudate or hemorrhage.    Left eye: Left conjunctiva is not injected. No chemosis, exudate or hemorrhage.    Pupils: Pupils are equal, round, and reactive to light.     Comments: Right pupil is fixed with leftward gaze; appears to  be due to strabismus  Cardiovascular:     Rate and Rhythm: Bradycardia present.     Pulses:          Radial pulses are 2+ on the right side and 2+ on the left side.       Dorsalis pedis pulses are 2+ on the right side and 2+ on the left side.     Heart sounds: Normal heart sounds.  Pulmonary:     Effort: Pulmonary effort is normal. Tachypnea present. No bradypnea or respiratory distress.     Breath sounds: No stridor. Examination of the right-lower field reveals rales. Rales present. No decreased breath sounds, wheezing or rhonchi.     Comments: Patient able to speak in full complete sentences without difficulty  Oxygen saturation 100% on room air Abdominal:     General: Bowel sounds are normal. There is no distension. There are no signs of injury.     Palpations: Abdomen is soft. There is no mass or pulsatile mass.     Tenderness: There is no abdominal tenderness. There is no right CVA tenderness, left CVA tenderness, guarding or rebound.     Hernia: There is no hernia in the umbilical area or ventral area.  Musculoskeletal:     Cervical back: Normal range of motion and neck supple. No edema, erythema, signs of trauma, rigidity, torticollis or crepitus. No pain with movement. Normal range of motion.     Right lower leg: No swelling, deformity, lacerations, tenderness or bony tenderness. No edema.     Left lower leg: No swelling, deformity, lacerations, tenderness or bony tenderness. No edema.  Feet:     Right foot:     Skin integrity: Dry skin present. No ulcer, blister, skin breakdown, erythema, warmth or fissure.      Toenail Condition: Right toenails are abnormally thick.     Left foot:     Skin integrity: Dry skin present. No ulcer, blister, skin breakdown, erythema, warmth or fissure.     Toenail Condition: Left toenails are abnormally thick.  Skin:    General: Skin is warm and dry.  Neurological:     General: No focal deficit present.     Mental Status: She is alert.     GCS: GCS eye subscore is 4. GCS verbal subscore is 5. GCS motor subscore is 6.     Cranial Nerves: No cranial nerve deficit or facial asymmetry.     Sensory: Sensation is intact.     Motor: No weakness, tremor, seizure activity or pronator drift.     Coordination: Finger-Nose-Finger Test normal.     Comments: CN II-XII intact; performed in supine position, +5 strength to bilateral upper extremities, +5 strength to dorsiflexion and plantarflexion, patient able to left both legs against gravity and hold each there without difficulty   Patient is alert to person  Psychiatric:        Behavior: Behavior is cooperative.     ED Results / Procedures / Treatments   Labs (all labs ordered are listed, but only abnormal results are displayed) Labs Reviewed  COMPREHENSIVE METABOLIC PANEL - Abnormal; Notable for the following components:      Result Value   Glucose, Bld 101 (*)    BUN 28 (*)    Creatinine, Ser 1.20 (*)    Calcium 8.8 (*)    Albumin 3.4 (*)    Alkaline Phosphatase 215 (*)    GFR, Estimated 44 (*)    All other components within normal limits  URINALYSIS, ROUTINE W REFLEX MICROSCOPIC - Abnormal; Notable for the following components:   APPearance HAZY (*)    Nitrite POSITIVE (*)    Leukocytes,Ua LARGE (*)    Bacteria, UA MANY (*)    All other components within normal limits  RESP PANEL BY RT-PCR (FLU A&B, COVID) ARPGX2  URINE CULTURE  BRAIN NATRIURETIC PEPTIDE  CBC WITH DIFFERENTIAL/PLATELET  TROPONIN I (HIGH SENSITIVITY)  TROPONIN I (HIGH SENSITIVITY)    EKG EKG Interpretation  Date/Time:  Tuesday November 30 2020 07:44:15 EDT Ventricular Rate:  49 PR Interval:  195 QRS Duration: 96 QT Interval:  462 QTC Calculation: 418 R Axis:   84 Text Interpretation: Sinus bradycardia Borderline right axis deviation no change from previoius Confirmed by Charlesetta Shanks [phone]) on 11/30/2020 8:07:52 AM   Radiology DG Chest Portable 1 View  Result Date: 11/30/2020 CLINICAL DATA:  Shortness of breath, chronic shortness of breath though much worse today, dizziness, history of dementia EXAM: PORTABLE CHEST 1 VIEW COMPARISON:  Portable exam 0841 hours compared 07/22/2014 FINDINGS: Normal heart size, mediastinal contours, and pulmonary vascularity. Questionable nodular density 10 mm diameter at medial RIGHT upper lobe projecting over posterior RIGHT fifth rib. Remaining lungs clear. No pulmonary infiltrate, pleural effusion, or pneumothorax. Bones demineralized. IMPRESSION: Questionable nodular density medial RIGHT upper lobe 10 mm diameter; CT chest recommended to exclude pulmonary nodule. No acute infiltrate. Electronically Signed   By: Lavonia Dana M.D.   On: 11/30/2020 08:50    Procedures Procedures   Medications Ordered in ED Medications  sodium chloride 0.9 % bolus 500 mL (0 mLs Intravenous Stopped 11/30/20 1359)  sulfamethoxazole-trimethoprim (BACTRIM DS) 800-160 MG per tablet 1 tablet (1 tablet Oral Given 11/30/20 1532)    ED Course  I have reviewed the triage vital signs and the nursing notes.  Pertinent labs & imaging results that were available during my care of the patient were reviewed by me and considered in my medical decision making (see chart for details).    MDM Rules/Calculators/A&P                          Alert 87 year old female no acute distress, nontoxic-appearing.  Patient has history of shortness of breath and is alert to person.    Patient presents with chief complaint of shortness of breath.  Reports that she woke up with shortness of breath has gradually improved.  When asked  patient denies any fevers, chills, chest pain, leg swelling or tenderness.    Attempted to contact facility staff at [facility] for further information on patient's breath this morning.  Name and number were left with staff member. 1026 spoke with staff representative see HPI for further details.  Patient is able to speak in full complete sentences without difficulty.  Oxygen saturation 100% on room air.  Subtle crackle noted to right lung base; all other lung fields clear to auscultation.  Normoactive bowel sounds, abdomen soft, nondistended, nontender.  No swelling or tenderness to bilateral lower extremities.  Patient right pupil is fixed with left lateral gaze likely due to strabismus.  Patient has no neurological deficits on physical exam.  Patient is noted to be bradycardic with heart rate in high 40s to low 50s.  Per chart review patient is on metoprolol.  Concern for shortness of breath as anginal equivalent, pneumonia, CHF. Will obtain chest x-ray, Covid and influenza testing, CMP, CBC, troponin x2 and BNP.  [phone] spoke with patient's daughter who is  at bedside.  Reports that patient has frequent episodes of shortness of breath on a weekly basis.  States that these episodes are usually related to anxiety.  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  This has been present for last 2 to 3 years. Patient denies any shortness of breath at this time is requesting to be transported home.  EKG shows sinus bradycardia with no significant change from previous tracing.  Troponin 3 and 3 with delta of 0.  Low suspicion for ACS at this time. BNP within normal limits low suspicion for CHF exacerbation. Chest x-ray shows no acute cardiopulmonary disease.  Did note questionable nodular density medial RIGHT upper lobe 10 mm diameter.  Will have patient follow-up with her regular provider for further imaging.  CMP shows creatinine elevated  at 1.20 and BUN elevated at 28.  Previous creatinine obtainen 2 years prior shows creatinine at upper range of normal at 0.95.  Unclear if patient's increased creatinine is due to AKI or worsening renal function.  Will give patient 500 L fluid bolus for possible dehydration. Alk phos is also noted to be increased at 215.  No recent previous results to compare.   CBC is unremarkable. Covid and influenza testing negative.  Urinalysis shows signs of infection with bacteria many, leukocytes large, nitrite positive, WBC 21-50.  Possible contamination as 11-20 squamous epithelial cells observed.  Urine culture pending.  Previous urine culture obtained in 03/2019 grew out Klebsiella pneumonia.  Patient's daughter reports that during hospital stay today patient's mother has had frequent urination and appears to be producing less urine which she describes as foul-smelling.  Will start patient on 7-day course Bactrim.  Patient given first dose in the emergency department.  On serial reexamination patient denies any shortness of breath.  Patient is in no acute respiratory distress.  Oxygen saturation remained 95% or greater on room air throughout her ED stay.  Patient hemodynamically stable.  Discussed results, findings, treatment and follow up. Patient advised of return precautions. Patient verbalized understanding and agreed with plan.  Patient was discussed with and evaluated by Dr. Vallery Ridge.   Final Clinical Impression(s) / ED Diagnoses Final diagnoses:  SOB (shortness of breath)  AKI (acute kidney injury) ([place])  Urinary tract infection without hematuria, site unspecified    Rx / DC Orders ED Discharge Orders         Ordered    sulfamethoxazole-trimethoprim (BACTRIM DS) 800-160 MG tablet  2 times daily        11/30/20 1508           Loni Beckwith, PA-C 11/30/20 1809    Charlesetta Shanks, MD 12/01/20 1046

## 2020-11-30 NOTE — ED Notes (Signed)
Unable to complete the rest of triage questions due to patient's memory issue.

## 2020-12-02 LAB — URINE CULTURE: Culture: >=100000 — AB

## 2020-12-03 ENCOUNTER — Telehealth: Payer: Self-pay | Admitting: *Deleted

## 2020-12-03 NOTE — Telephone Encounter (Signed)
Post ED Visit - Positive Culture Follow-up  Culture report reviewed by antimicrobial stewardship pharmacist: [facility] Team []  Elenor Quinones, Pharm.D. [x]  Heide Guile, Pharm.D., BCPS AQ-ID []  Parks Neptune, Pharm.D., BCPS []  Alycia Rossetti, Pharm.D., BCPS []  [place], Pharm.D., BCPS, AAHIVP []  Legrand Como, Pharm.D., BCPS, AAHIVP []  Salome Arnt, PharmD, BCPS []  Johnnette Gourd, PharmD, BCPS []  Hughes Better, PharmD, BCPS []  Leeroy Cha, PharmD []  Laqueta Linden, PharmD, BCPS []  Albertina Parr, PharmD  [place] Team []  Leodis Sias, PharmD []  Lindell Spar, PharmD []  Royetta Asal, PharmD []  Graylin Shiver, Rph []  Rema Fendt) Glennon Mac, PharmD []  Arlyn Dunning, PharmD []  Netta Cedars, PharmD []  Dia Sitter, PharmD []  Leone Haven, PharmD []  Gretta Arab, PharmD []  Theodis Shove, PharmD []  Peggyann Juba, PharmD []  Reuel Boom, PharmD   Positive urine culture Treated with Sulfamethoxazole-Trimethorim, organism sensitive to the same and no further patient follow-up is required at this time.  Harlon Flor [facility] 12/03/2020, 10:12 AM

## 2021-06-30 ENCOUNTER — Encounter (HOSPITAL_COMMUNITY): Payer: Self-pay

## 2021-06-30 ENCOUNTER — Emergency Department (HOSPITAL_COMMUNITY): Payer: Medicare Other

## 2021-06-30 ENCOUNTER — Emergency Department (HOSPITAL_BASED_OUTPATIENT_CLINIC_OR_DEPARTMENT_OTHER): Payer: Medicare Other

## 2021-06-30 ENCOUNTER — Emergency Department (HOSPITAL_COMMUNITY)
Admission: EM | Admit: 2021-06-30 | Discharge: 2021-06-30 | Disposition: A | Discharge status: Home / Self Care | Payer: Medicare Other | Attending: Emergency Medicine | Admitting: Emergency Medicine

## 2021-06-30 DIAGNOSIS — Z87891 Personal history of nicotine dependence: Secondary | ICD-10-CM | POA: Insufficient documentation

## 2021-06-30 DIAGNOSIS — I10 Essential (primary) hypertension: Secondary | ICD-10-CM | POA: Insufficient documentation

## 2021-06-30 DIAGNOSIS — E039 Hypothyroidism, unspecified: Secondary | ICD-10-CM | POA: Diagnosis not present

## 2021-06-30 DIAGNOSIS — Z7982 Long term (current) use of aspirin: Secondary | ICD-10-CM | POA: Diagnosis not present

## 2021-06-30 DIAGNOSIS — R0902 Hypoxemia: Secondary | ICD-10-CM | POA: Diagnosis not present

## 2021-06-30 DIAGNOSIS — Z85828 Personal history of other malignant neoplasm of skin: Secondary | ICD-10-CM | POA: Insufficient documentation

## 2021-06-30 DIAGNOSIS — Z79899 Other long term (current) drug therapy: Secondary | ICD-10-CM | POA: Insufficient documentation

## 2021-06-30 DIAGNOSIS — Z20822 Contact with and (suspected) exposure to covid-19: Secondary | ICD-10-CM | POA: Insufficient documentation

## 2021-06-30 DIAGNOSIS — R0602 Shortness of breath: Secondary | ICD-10-CM

## 2021-06-30 DIAGNOSIS — R079 Chest pain, unspecified: Secondary | ICD-10-CM | POA: Insufficient documentation

## 2021-06-30 LAB — CBC WITH DIFFERENTIAL/PLATELET
Abs Immature Granulocytes: 0.02 10*3/uL (ref 0.00–0.07)
Basophils Absolute: 0.1 10*3/uL (ref 0.0–0.1)
Basophils Relative: 1 %
Eosinophils Absolute: 0.2 10*3/uL (ref 0.0–0.5)
Eosinophils Relative: 4 %
HCT: 38.6 % (ref 36.0–46.0)
Hemoglobin: 11.8 g/dL — ABNORMAL LOW (ref 12.0–15.0)
Immature Granulocytes: 0 %
Lymphocytes Relative: 19 %
Lymphs Abs: 1.3 10*3/uL (ref 0.7–4.0)
MCH: 27 pg (ref 26.0–34.0)
MCHC: 30.6 g/dL (ref 30.0–36.0)
MCV: 88.3 fL (ref 80.0–100.0)
Monocytes Absolute: 0.6 10*3/uL (ref 0.1–1.0)
Monocytes Relative: 8 %
Neutro Abs: 4.5 10*3/uL (ref 1.7–7.7)
Neutrophils Relative %: 68 %
Platelets: 230 10*3/uL (ref 150–400)
RBC: 4.37 MIL/uL (ref 3.87–5.11)
RDW: 14.4 % (ref 11.5–15.5)
WBC: 6.6 10*3/uL (ref 4.0–10.5)
nRBC: 0 % (ref 0.0–0.2)

## 2021-06-30 LAB — COMPREHENSIVE METABOLIC PANEL
ALT: 13 U/L (ref 0–44)
AST: 22 U/L (ref 15–41)
Albumin: 3.4 g/dL — ABNORMAL LOW (ref 3.5–5.0)
Alkaline Phosphatase: 147 U/L — ABNORMAL HIGH (ref 38–126)
Anion gap: 7 (ref 5–15)
BUN: 25 mg/dL — ABNORMAL HIGH (ref 8–23)
CO2: 23 mmol/L (ref 22–32)
Calcium: 8.6 mg/dL — ABNORMAL LOW (ref 8.9–10.3)
Chloride: 105 mmol/L (ref 98–111)
Creatinine, Ser: 1.13 mg/dL — ABNORMAL HIGH (ref 0.44–1.00)
GFR, Estimated: 47 mL/min — ABNORMAL LOW (ref >60)
Glucose, Bld: 166 mg/dL — ABNORMAL HIGH (ref 70–99)
Potassium: 4.1 mmol/L (ref 3.5–5.1)
Sodium: 135 mmol/L (ref 135–145)
Total Bilirubin: 0.5 mg/dL (ref 0.3–1.2)
Total Protein: 6.3 g/dL — ABNORMAL LOW (ref 6.5–8.1)

## 2021-06-30 LAB — RESP PANEL BY RT-PCR (FLU A&B, COVID) ARPGX2
Influenza A by PCR: NEGATIVE
Influenza B by PCR: NEGATIVE
SARS Coronavirus 2 by RT PCR: NEGATIVE

## 2021-06-30 LAB — D-DIMER, QUANTITATIVE: D-Dimer, Quant: 1.61 ug/mL-FEU — ABNORMAL HIGH (ref 0.00–0.50)

## 2021-06-30 LAB — TROPONIN I (HIGH SENSITIVITY)
Troponin I (High Sensitivity): 7 ng/L (ref <18)
Troponin I (High Sensitivity): 17 ng/L (ref <18)

## 2021-06-30 LAB — LIPASE, BLOOD: Lipase: 63 U/L — ABNORMAL HIGH (ref 11–51)

## 2021-06-30 MED ORDER — ALBUTEROL SULFATE HFA 108 (90 BASE) MCG/ACT IN AERS: 1.0000 | INHALATION_SPRAY | Freq: Four times a day (QID) | RESPIRATORY_TRACT | 0 refills | Status: DC | PRN

## 2021-06-30 MED ORDER — TECHNETIUM TO 99M ALBUMIN AGGREGATED
4.4000 | Freq: Once | INTRAVENOUS | Status: AC | PRN
  Administered 2021-06-30: 4.4 via INTRAVENOUS

## 2021-06-30 NOTE — ED Provider Notes (Signed)
Pt signed out by Dr. Maryan Rued.  Pt's 2nd troponin nl.  She was able to ambulate without problems.  O2 sat 93-96% with ambulation.  Pt feels well.  She is stable for d/c.  Return if worse.  F/u with pcp.   Isla Pence, MD 06/30/21 1721

## 2021-06-30 NOTE — Discharge Instructions (Addendum)
No signs of heart problems today.  Low risk for blood clots.  Maybe the pain today was a result of lung spasm from coughing.  If you start having frequent pain, worsening shortness or breath, fever, passing out or other concerns return to the ER.  You were given an inhaler today that can be used if you have a coughing fit to help with pain and shortness of breath but it is just as needed.  You do not need to use it all the time.

## 2021-06-30 NOTE — ED Notes (Signed)
Update given to NM for pulmonary perfusion.

## 2021-06-30 NOTE — ED Notes (Signed)
Pt ambulated with and without FWW on RA and o2 sat remained between 93-96% during exertion. Gait steady. No issues observed.

## 2021-06-30 NOTE — ED Notes (Signed)
Vascular at bedside

## 2021-06-30 NOTE — ED Triage Notes (Signed)
Pt BIB [facility] from [facility] due to shortness of breath. Pt was 88% on RA with EMS and was placed on 2L [place]. Pt has hx of HTN and Dementia. Pt is at baseline, A&O to self and situation. Pt states she was experiencing CP, however, that has now resolved.

## 2021-06-30 NOTE — ED Notes (Signed)
Pt taken to NM 

## 2021-06-30 NOTE — Progress Notes (Signed)
BLE venous duplex has been completed.  Preliminary results given to Dr. Maryan Rued.   Results can be found under chart review under CV PROC. 06/30/2021 4:44 PM Tobi Leinweber RVT, RDMS

## 2021-06-30 NOTE — ED Provider Notes (Signed)
[facility] DEPT Provider Note   CSN: [id number] Arrival date & time: 06/30/21  1106     History Chief Complaint  Patient presents with   Shortness of Breath    Sarah Acevedo is a 87 y.o. female.  Patient is an 87 year old female with a history of hypertension, hypothyroidism, depression and psychosis of the elderly who presents today due to complaint of sudden onset of shortness of breath, chest pain and hypoxia.  Patient reports that she coughs every morning and that is not unusual however this morning  she started coughing but had a severe pain in her lower chest and upper abdomen that did not radiate and was sharp in nature and felt like she could not catch her breath.  She did describe some yellow sputum but denies any recent fever, nausea, vomiting, diarrhea.  She did eat breakfast this morning and had no issues.  When EMS arrived patient was hypoxic at 88% and appeared uncomfortable and short of breath.  Nurse practitioner also witnessed this similar thing when she was doing rounds this morning.  Patient was placed on 2 L nasal cannula and brought to the hospital.  Currently patient reports the pain is completely gone.  She denies feeling shortness of breath at this time.  She does not use inhalers regularly and has never been a smoker.  She denies any unilateral leg pain or swelling or prior history of clots.  The history is provided by the patient, medical records and the EMS personnel.  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     History reviewed. No pertinent family history.  Social History   Tobacco Use   Smoking status: Former   Smokeless tobacco: Never  Substance Use Topics   Alcohol use: No   Drug use: No    Home Medications Prior to Admission medications   Medication Sig Start Date End Date Taking? Authorizing Provider  acetaminophen (TYLENOL) 325 MG tablet Take 650 mg by mouth in the morning, at noon, and at bedtime.   Yes [provider]  aspirin 81 MG chewable tablet Chew 81 mg by mouth daily.   Yes [provider]  cetirizine (ZYRTEC) 10 MG tablet Take 10 mg by mouth daily as needed for allergies.   Yes [provider]  Cholecalciferol (VITAMIN D3) 50 MCG (2000 UT) TABS Take 2,000 Units by mouth daily.   Yes [provider]  donepezil (ARICEPT) 10 MG tablet Take 15 mg by mouth at bedtime.   Yes [provider]  famotidine (PEPCID) 20 MG tablet Take 20 mg by mouth daily.  Yes [provider]  guaiFENesin (ROBITUSSIN) 100 MG/5ML liquid Take 100 mg by mouth every 4 (four) hours as needed for cough.   Yes [provider]  levothyroxine (SYNTHROID, LEVOTHROID) 50 MCG tablet Take 50 mcg by mouth daily.   Yes [provider]  Melatonin 3 MG TABS Take 3 mg by mouth at bedtime. Take with 5mg  tablet for a total nightly dose of 8mg .   Yes [provider]  melatonin 5 MG TABS Take 5 mg by mouth at bedtime. Take with 3mg  tablet for a total nightly dose of 8mg .   Yes [provider]  metoprolol succinate (TOPROL-XL) 100 MG 24 hr tablet Take 100 mg by mouth daily. Take with or immediately following a meal.   Yes [provider]  OLANZapine (ZYPREXA) 20 MG tablet Take 10 mg by mouth See admin instructions. 10mg  at 1700 and 10mg  at 2000 each evening.   Yes [provider]  rosuvastatin (CRESTOR) 10 MG tablet Take 10 mg by mouth daily.   Yes [provider]  sertraline (ZOLOFT) 50 MG tablet Take 75 mg by mouth daily.   Yes [provider]  traZODone (DESYREL) 100 MG tablet Take 100 mg by mouth at bedtime.   Yes [provider]  trimethoprim-polymyxin b (POLYTRIM) ophthalmic solution Place 1 drop into the right eye every 4 (four) hours while awake. For 7 days 06/24/21 07/01/21 Yes [provider]  zinc sulfate 220 (50 Zn) MG capsule Take 220 mg by mouth daily.   Yes [provider]    Allergies    Contrast media [iodinated diagnostic agents] and Iodine  Review of Systems   Review of Systems  Respiratory:  Positive for shortness of breath.   All other systems reviewed and are negative.  Physical Exam Updated Vital Signs BP (!) 115/54   Pulse 60   Temp 98.3 F (36.8 C)   Resp 20   Ht 5\' 3"  (1.6 m)   Wt 53.5 kg   SpO2 100%   BMI 20.90 kg/m   Physical Exam Vitals and nursing note reviewed.  Constitutional:      General: She is not in acute distress.    Appearance: She is well-developed.  HENT:     Head: Normocephalic and atraumatic.     Mouth/Throat:     Mouth: Mucous membranes are moist.  Eyes:     Conjunctiva/sclera: Conjunctivae normal.     Pupils: Pupils are equal, round, and reactive to light.  Cardiovascular:     Rate and Rhythm: Normal rate and regular rhythm.     Heart sounds: No murmur heard. Pulmonary:     Effort: Pulmonary effort is normal. No tachypnea or respiratory distress.     Breath sounds: Normal breath sounds. No wheezing or rales.  Chest:     Chest wall: No tenderness.  Abdominal:     General: There is no distension.     Palpations: Abdomen is soft.     Tenderness: There is no abdominal tenderness. There is  no guarding or rebound.  Musculoskeletal:        General: No tenderness. Normal range of motion.     Cervical back: Normal range of motion and neck supple.     Right lower leg: No edema.     Left lower leg: No edema.  Skin:    General: Skin is warm and dry.     Findings: No erythema or rash.  Neurological:     Mental Status:  She is alert and oriented to person, place, and time. Mental status is at baseline.  Psychiatric:        Mood and Affect: Mood normal.        Behavior: Behavior normal.    ED Results / Procedures / Treatments   Labs (all labs ordered are listed, but only abnormal results are displayed) Labs Reviewed  CBC WITH DIFFERENTIAL/PLATELET - Abnormal; Notable for the following components:      Result Value   Hemoglobin 11.8 (*)    All other components within normal limits  COMPREHENSIVE METABOLIC PANEL - Abnormal; Notable for the following components:   Glucose, Bld 166 (*)    BUN 25 (*)    Creatinine, Ser 1.13 (*)    Calcium 8.6 (*)    Total Protein 6.3 (*)    Albumin 3.4 (*)    Alkaline Phosphatase 147 (*)    GFR, Estimated 47 (*)    All other components within normal limits  LIPASE, BLOOD - Abnormal; Notable for the following components:   Lipase 63 (*)    All other components within normal limits  D-DIMER, QUANTITATIVE - Abnormal; Notable for the following components:   D-Dimer, Quant 1.61 (*)    All other components within normal limits  RESP PANEL BY RT-PCR (FLU A&B, COVID) ARPGX2  TROPONIN I (HIGH SENSITIVITY)    EKG None  Radiology DG Chest Port 1 View  Result Date: 06/30/2021 CLINICAL DATA:  Chest pain, shortness of breath EXAM: PORTABLE CHEST 1 VIEW COMPARISON:  11/30/2020 FINDINGS: The heart size and mediastinal contours are within normal limits. Both lungs are clear. The visualized skeletal structures are unremarkable. IMPRESSION: No acute abnormality of the lungs. Electronically Signed   By: Delanna Ahmadi M.D.   On: 06/30/2021 11:56     Procedures Procedures   Medications Ordered in ED Medications - No data to display  ED Course  I have reviewed the triage vital signs and the nursing notes.  Pertinent labs & imaging results that were available during my care of the patient were reviewed by me and considered in my medical decision making (see chart for details).    MDM Rules/Calculators/A&P                           Elderly female presenting today with sudden onset of chest pain during a coughing episode and feeling short of breath.  Patient was found to be hypoxic when EMS arrived to 88% on room air.  Patient was given 2 L of oxygen however by arrival to the emergency room her symptoms had resolved.  She did not receive any medications prior to arrival.  She did eat breakfast this morning and had been feeling her normal self.  She has no known heart history.  She denies recent infectious symptoms except for cough which she reports she always has but may be its been a little worse.  Denies any fever or abdominal pain.  On exam patient has no acute findings.  Low risk Wells score.  Concern for possible cardiac pathology versus pneumonia versus PE.  12:44 PM Patient's chest x-ray without acute findings, D-dimer elevated at 1.6, lipase mildly elevated at 63, CMP without acute findings, CBC within normal limits.  Patient does have a contrast media allergy where it reports she had hives.  VQ scan ordered.  Initial troponin is negative but will get a delta.  Patient remains chest pain-free with normal oxygen saturation.  3:10 PM V/Q scan shows intermediate risk.  Overall risk of 1 which is low.  Will get bilateral dopplers.  Delta trop pending.  Pt has had no further hypoxia and remains well appearing.  4:04 PM DVT scan is neg and with neg DVT scan, low risk and intermediate probability feel likelihood of PE is fairly low.  Delta trop is pending but if normal will d/c home.  Will give rescue inhaler to use for coughing fits.   Return precautions given.  MDM   Amount and/or Complexity of Data Reviewed Clinical lab tests: reviewed and ordered Tests in the radiology section of CPT: ordered and reviewed Tests in the medicine section of CPT: reviewed and ordered Independent visualization of images, tracings, or specimens: yes  Patient Progress Patient progress: stable     Final Clinical Impression(s) / ED Diagnoses Final diagnoses:  Nonspecific chest pain    Rx / DC Orders ED Discharge Orders          Ordered    albuterol (VENTOLIN HFA) 108 (90 Base) MCG/ACT inhaler  Every 6 hours PRN        06/30/21 1615             Blanchie Dessert, MD 06/30/21 1615

## 2021-06-30 NOTE — ED Notes (Signed)
Dr. Plunkett at bedside.  

## 2021-07-21 ENCOUNTER — Telehealth (HOSPITAL_COMMUNITY): Payer: Self-pay | Admitting: Emergency Medicine

## 2021-07-21 ENCOUNTER — Emergency Department (HOSPITAL_COMMUNITY): Payer: Medicare Other

## 2021-07-21 ENCOUNTER — Other Ambulatory Visit: Payer: Self-pay

## 2021-07-21 ENCOUNTER — Emergency Department (HOSPITAL_COMMUNITY)
Admission: EM | Admit: 2021-07-21 | Discharge: 2021-07-21 | Disposition: A | Discharge status: Home / Self Care | Payer: Medicare Other | Attending: Emergency Medicine | Admitting: Emergency Medicine

## 2021-07-21 ENCOUNTER — Encounter (HOSPITAL_COMMUNITY): Payer: Self-pay | Admitting: Emergency Medicine

## 2021-07-21 DIAGNOSIS — E039 Hypothyroidism, unspecified: Secondary | ICD-10-CM | POA: Insufficient documentation

## 2021-07-21 DIAGNOSIS — Z20822 Contact with and (suspected) exposure to covid-19: Secondary | ICD-10-CM | POA: Diagnosis not present

## 2021-07-21 DIAGNOSIS — I10 Essential (primary) hypertension: Secondary | ICD-10-CM | POA: Insufficient documentation

## 2021-07-21 DIAGNOSIS — Z7982 Long term (current) use of aspirin: Secondary | ICD-10-CM | POA: Diagnosis not present

## 2021-07-21 DIAGNOSIS — N3000 Acute cystitis without hematuria: Secondary | ICD-10-CM | POA: Insufficient documentation

## 2021-07-21 DIAGNOSIS — Z87891 Personal history of nicotine dependence: Secondary | ICD-10-CM | POA: Insufficient documentation

## 2021-07-21 DIAGNOSIS — R4182 Altered mental status, unspecified: Secondary | ICD-10-CM | POA: Insufficient documentation

## 2021-07-21 DIAGNOSIS — R443 Hallucinations, unspecified: Secondary | ICD-10-CM | POA: Insufficient documentation

## 2021-07-21 DIAGNOSIS — L600 Ingrowing nail: Secondary | ICD-10-CM | POA: Insufficient documentation

## 2021-07-21 DIAGNOSIS — Z7951 Long term (current) use of inhaled steroids: Secondary | ICD-10-CM | POA: Diagnosis not present

## 2021-07-21 DIAGNOSIS — Z79899 Other long term (current) drug therapy: Secondary | ICD-10-CM | POA: Diagnosis not present

## 2021-07-21 DIAGNOSIS — Z85828 Personal history of other malignant neoplasm of skin: Secondary | ICD-10-CM | POA: Insufficient documentation

## 2021-07-21 LAB — CBC WITH DIFFERENTIAL/PLATELET
Abs Immature Granulocytes: 0.01 10*3/uL (ref 0.00–0.07)
Basophils Absolute: 0.1 10*3/uL (ref 0.0–0.1)
Basophils Relative: 1 %
Eosinophils Absolute: 0.3 10*3/uL (ref 0.0–0.5)
Eosinophils Relative: 4 %
HCT: 35.4 % — ABNORMAL LOW (ref 36.0–46.0)
Hemoglobin: 10.8 g/dL — ABNORMAL LOW (ref 12.0–15.0)
Immature Granulocytes: 0 %
Lymphocytes Relative: 26 %
Lymphs Abs: 1.8 10*3/uL (ref 0.7–4.0)
MCH: 27.3 pg (ref 26.0–34.0)
MCHC: 30.5 g/dL (ref 30.0–36.0)
MCV: 89.4 fL (ref 80.0–100.0)
Monocytes Absolute: 0.8 10*3/uL (ref 0.1–1.0)
Monocytes Relative: 12 %
Neutro Abs: 4 10*3/uL (ref 1.7–7.7)
Neutrophils Relative %: 57 %
Platelets: 226 10*3/uL (ref 150–400)
RBC: 3.96 MIL/uL (ref 3.87–5.11)
RDW: 14.4 % (ref 11.5–15.5)
WBC: 7 10*3/uL (ref 4.0–10.5)
nRBC: 0 % (ref 0.0–0.2)

## 2021-07-21 LAB — URINALYSIS, ROUTINE W REFLEX MICROSCOPIC
Bilirubin Urine: NEGATIVE
Glucose, UA: NEGATIVE mg/dL
Hgb urine dipstick: NEGATIVE
Ketones, ur: NEGATIVE mg/dL
Leukocytes,Ua: NEGATIVE
Nitrite: POSITIVE — AB
Protein, ur: NEGATIVE mg/dL
Specific Gravity, Urine: 1.018 (ref 1.005–1.030)
pH: 5 (ref 5.0–8.0)

## 2021-07-21 LAB — COMPREHENSIVE METABOLIC PANEL
ALT: 12 U/L (ref 0–44)
AST: 17 U/L (ref 15–41)
Albumin: 3.2 g/dL — ABNORMAL LOW (ref 3.5–5.0)
Alkaline Phosphatase: 141 U/L — ABNORMAL HIGH (ref 38–126)
Anion gap: 5 (ref 5–15)
BUN: 36 mg/dL — ABNORMAL HIGH (ref 8–23)
CO2: 25 mmol/L (ref 22–32)
Calcium: 8.6 mg/dL — ABNORMAL LOW (ref 8.9–10.3)
Chloride: 108 mmol/L (ref 98–111)
Creatinine, Ser: 1.04 mg/dL — ABNORMAL HIGH (ref 0.44–1.00)
GFR, Estimated: 52 mL/min — ABNORMAL LOW (ref >60)
Glucose, Bld: 100 mg/dL — ABNORMAL HIGH (ref 70–99)
Potassium: 4.5 mmol/L (ref 3.5–5.1)
Sodium: 138 mmol/L (ref 135–145)
Total Bilirubin: 0.5 mg/dL (ref 0.3–1.2)
Total Protein: 6.1 g/dL — ABNORMAL LOW (ref 6.5–8.1)

## 2021-07-21 LAB — RESP PANEL BY RT-PCR (FLU A&B, COVID) ARPGX2
Influenza A by PCR: NEGATIVE
Influenza B by PCR: NEGATIVE
SARS Coronavirus 2 by RT PCR: NEGATIVE

## 2021-07-21 MED ORDER — CEPHALEXIN 500 MG PO CAPS: 500.0000 mg | ORAL_CAPSULE | Freq: Three times a day (TID) | ORAL | 0 refills | Status: DC

## 2021-07-21 MED ORDER — CEPHALEXIN 500 MG PO CAPS
500.0000 mg | ORAL_CAPSULE | Freq: Once | ORAL | Status: AC
  Administered 2021-07-21: 13:00:00 500 mg via ORAL
  Filled 2021-07-21: qty 1

## 2021-07-21 MED ORDER — BUPIVACAINE HCL (PF) 0.5 % IJ SOLN
20.0000 mL | Freq: Once | INTRAMUSCULAR | Status: AC
  Administered 2021-07-21: 13:00:00 20 mL
  Filled 2021-07-21: qty 30

## 2021-07-21 NOTE — ED Provider Notes (Signed)
[facility] DEPT Provider Note   CSN: [id number] Arrival date & time: 07/21/21  4098     History Chief Complaint  Patient presents with   Altered Mental Status    Sarah Acevedo is a 87 y.o. female.  Pt presents to the ED today with AMS.  Pt has a hx of dementia and has a diagnosis of psychosis on Epic.  She lives in a memory care facility and has been having some hallucinations.  Pt is unable to tell me any hx.      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     History reviewed. No pertinent family history.  Social History   Tobacco Use   Smoking status: Former   Smokeless tobacco: Never  Substance Use Topics   Alcohol use: No   Drug use: No    Home Medications Prior to Admission medications   Medication Sig Start Date End Date Taking? Authorizing Provider  albuterol (VENTOLIN HFA) 108 (90 Base) MCG/ACT inhaler Inhale 1-2 puffs into the lungs every 6 (six) hours as needed for wheezing or shortness of breath (use for coughing fits or shortness of breath as needed only). Patient taking differently: Inhale 2 puffs into the lungs every 6 (six) hours as needed for wheezing or shortness of breath (coughing fits). 06/30/21  Yes Blanchie Dessert, MD   aspirin 81 MG chewable tablet Chew 81 mg by mouth daily.   Yes [provider]  Cholecalciferol (VITAMIN D3) 50 MCG (2000 UT) TABS Take 2,000 Units by mouth daily.   Yes [provider]  famotidine (PEPCID) 20 MG tablet Take 20 mg by mouth daily.   Yes [provider]  guaiFENesin (ROBITUSSIN) 100 MG/5ML liquid Take 100 mg by mouth every 4 (four) hours as needed for cough.   Yes [provider]  levothyroxine (SYNTHROID, LEVOTHROID) 50 MCG tablet Take 50 mcg by mouth daily.   Yes [provider]  Melatonin 3 MG TABS Take 3 mg by mouth at bedtime. Take with 5mg  tablet for a total nightly dose of 8mg .   Yes [provider]  melatonin 5 MG TABS Take 5 mg by mouth at bedtime. Take with 3mg  tablet for a total nightly dose of 8mg .   Yes [provider]  metoprolol succinate (TOPROL-XL) 100 MG 24 hr tablet Take 100 mg by mouth daily. Take with or immediately following a meal.   Yes [provider]  OLANZapine (ZYPREXA) 20 MG tablet Take 10 mg by mouth See admin instructions. 10mg  at 1700 and 10mg  at 2000 each evening.   Yes [provider]  sertraline (ZOLOFT) 50 MG tablet Take  75 mg by mouth daily.   Yes [provider]  traMADol (ULTRAM) 50 MG tablet Take 25 mg by mouth 2 (two) times daily. 07/07/21  Yes [provider]  traZODone (DESYREL) 100 MG tablet Take 100 mg by mouth at bedtime.   Yes [provider]    Allergies    Contrast media [iodinated diagnostic agents] and Iodine  Review of Systems   Review of Systems  Unable to perform ROS: Dementia  All other systems reviewed and are negative.  Physical Exam Updated Vital Signs BP (!) 145/71 (BP Location: Right Arm)   Pulse 60   Temp 97.7 F (36.5 C) (Oral)   Resp 18   Ht 5\' 3"  (1.6 m)   Wt 54 kg   SpO2 96%   BMI 21.09 kg/m   Physical Exam Vitals and nursing note reviewed.  Constitutional:      Appearance: Normal appearance.   HENT:     Head: Normocephalic and atraumatic.     Right Ear: External ear normal.     Left Ear: External ear normal.     Nose: Nose normal.     Mouth/Throat:     Mouth: Mucous membranes are moist.     Pharynx: Oropharynx is clear.  Eyes:     Extraocular Movements: Extraocular movements intact.     Conjunctiva/sclera: Conjunctivae normal.     Pupils: Pupils are equal, round, and reactive to light.  Cardiovascular:     Rate and Rhythm: Normal rate and regular rhythm.     Pulses: Normal pulses.     Heart sounds: Normal heart sounds.  Pulmonary:     Effort: Pulmonary effort is normal.     Breath sounds: Normal breath sounds.  Abdominal:     General: Abdomen is flat. Bowel sounds are normal.     Palpations: Abdomen is soft.  Musculoskeletal:        General: Normal range of motion.     Cervical back: Normal range of motion and neck supple.     Comments: Toenail is starting to ingrow to left great toe.  No paronychia.  Mild cellulitis.  Skin:    General: Skin is warm.     Capillary Refill: Capillary refill takes less than 2 seconds.  Neurological:     General: No focal deficit present.     Mental Status: She is alert.     Comments: Pt is able to tell me her name  Psychiatric:        Mood and Affect: Mood normal.        Behavior: Behavior normal.    ED Results / Procedures / Treatments   Labs (all labs ordered are listed, but only abnormal results are displayed) Labs Reviewed  CBC WITH DIFFERENTIAL/PLATELET - Abnormal; Notable for the following components:      Result Value   Hemoglobin 10.8 (*)    HCT 35.4 (*)    All other components within normal limits  COMPREHENSIVE METABOLIC PANEL - Abnormal; Notable for the following components:   Glucose, Bld 100 (*)    BUN 36 (*)    Creatinine, Ser 1.04 (*)    Calcium 8.6 (*)    Total Protein 6.1 (*)    Albumin 3.2 (*)    Alkaline Phosphatase 141 (*)    GFR, Estimated 52 (*)    All other components within normal limits   URINALYSIS, ROUTINE W REFLEX MICROSCOPIC - Abnormal; Notable for the following components:   Nitrite POSITIVE (*)  Bacteria, UA MANY (*)    All other components within normal limits  RESP PANEL BY RT-PCR (FLU A&B, COVID) ARPGX2  URINE CULTURE  CBG MONITORING, ED    EKG None  Radiology CT Head Wo Contrast  Result Date: 07/21/2021 CLINICAL DATA:  Confusion.  Mental status change of unknown cause. EXAM: CT HEAD WITHOUT CONTRAST TECHNIQUE: Contiguous axial images were obtained from the base of the skull through the vertex without intravenous contrast. COMPARISON:  10/14/2019.  03/22/2019.  07/17/2014. FINDINGS: Brain: No focal abnormality affects the brainstem or cerebellum. Cerebral hemispheres show mild age related volume loss with mild chronic small-vessel ischemic change of the white matter. No sign of acute infarction, intra-axial mass lesion, hemorrhage, hydrocephalus or extra-axial collection. 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 Further evaluation with CT angiography is suggested. Meningioma is considered but I think is much less likely. Vascular: There is atherosclerotic calcification of the major vessels at the base of the brain. See above. Skull: Normal Sinuses/Orbits: Clear/normal Other: None IMPRESSION: No acute brain parenchymal finding. Age related volume loss. Chronic small-vessel ischemic changes of the hemispheric white matter. 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 Meningioma is possible but felt less likely. CT angiography recommended for further characterization. Electronically Signed   By: Nelson Chimes M.D.   On: 07/21/2021 10:11    Procedures .Nerve Block  Date/Time: 07/21/2021 1:16 PM Performed by: Isla Pence, MD Authorized by: Isla Pence, MD   Consent:    Consent obtained:  Verbal   Risks discussed:  Pain Indications:     Indications:  Pain relief Location:    Body area:  Lower extremity   Laterality:  Left Pre-procedure details:    Skin preparation:  Povidone-iodine Procedure details:    Block needle gauge:  25 G   Steroid injected:  None   Additive injected:  None Post-procedure details:    Procedure completion:  Tolerated well, no immediate complications Comments:     Digital block left great toe   Medications Ordered in ED Medications  bupivacaine (MARCAINE) 0.5 % injection 20 mL (20 mLs Infiltration Given 07/21/21 1301)  cephALEXin (KEFLEX) capsule 500 mg (500 mg Oral Given 07/21/21 1303)    ED Course  I have reviewed the triage vital signs and the nursing notes.  Pertinent labs & imaging results that were available during my care of the patient were reviewed by me and considered in my medical decision making (see chart for details).    MDM Rules/Calculators/A&P                           Nail is starting to ingrow, but it is not causing infection, just some discomfort.  Nail is very thick and has onychomycosis.  It think it is better for pt if she sees the podiatrist.  Daughter will take her there.    Pt's UTI and cellulitis to toe will be treated with keflex.  Mild AMS likely from UTI.  Pt is able to ambulate and is alert. Pt is stable for d/c.  Return if worse.  Final Clinical Impression(s) / ED Diagnoses Final diagnoses:  Acute cystitis without hematuria  Ingrown toenail of left foot    Rx / DC Orders ED Discharge Orders     None        Isla Pence, MD 07/21/21 1319

## 2021-07-21 NOTE — Telephone Encounter (Signed)
Encounter created to prescribe cephalexin for UTI. Pt seen by [facility] in ER at [facility] earlier today. Facility requested printed rx.

## 2021-07-21 NOTE — ED Notes (Signed)
Pt discharged with daughter.

## 2021-07-21 NOTE — ED Triage Notes (Signed)
Pt to ER via EMS from [facility].  Pt has dementia and is confused at baseline.  Per facility pt has had increased confusion and visual hallucinations since Monday.  No other c/o noted at this time.

## 2021-07-22 NOTE — ED Notes (Signed)
RN called and said they never received faxed prescription and pt in need of abx. New prescription for abx written and faxed.

## 2021-07-23 LAB — URINE CULTURE: Culture: 80000 — AB

## 2021-07-24 ENCOUNTER — Telehealth: Payer: Self-pay | Admitting: Emergency Medicine

## 2021-07-24 NOTE — Telephone Encounter (Signed)
Post ED Visit - Positive Culture Follow-up  Culture report reviewed by antimicrobial stewardship pharmacist: [place] Team []  Elenor Quinones, Pharm.D. []  Heide Guile, Pharm.D., BCPS AQ-ID []  Parks Neptune, Pharm.D., BCPS []  Alycia Rossetti, Pharm.D., BCPS []  [employer], Pharm.D., BCPS, AAHIVP []  Legrand Como, Pharm.D., BCPS, AAHIVP []  Salome Arnt, PharmD, BCPS []  Johnnette Gourd, PharmD, BCPS []  Hughes Better, PharmD, BCPS []  Leeroy Cha, PharmD []  Laqueta Linden, PharmD, BCPS []  Albertina Parr, PharmD  [facility] Team []  Leodis Sias, PharmD []  Lindell Spar, PharmD []  Royetta Asal, PharmD []  Graylin Shiver, Rph []  Rema Fendt) Glennon Mac, PharmD []  Arlyn Dunning, PharmD []  Netta Cedars, PharmD []  Dia Sitter, PharmD []  Leone Haven, PharmD []  Gretta Arab, PharmD []  Theodis Shove, PharmD []  Peggyann Juba, PharmD [x]  Dillard Essex, PharmD   Positive urine culture Treated with Keflex, organism sensitive to the same and no further patient follow-up is required at this time.  Sandi Raveling Hermen Mario 07/24/2021, 11:47 AM

## 2021-07-25 ENCOUNTER — Telehealth (HOSPITAL_COMMUNITY): Payer: Self-pay | Admitting: Student

## 2021-07-25 MED ORDER — CEPHALEXIN 500 MG PO CAPS: 500.0000 mg | ORAL_CAPSULE | Freq: Three times a day (TID) | ORAL | 0 refills | Status: DC

## 2021-07-25 MED ORDER — CEPHALEXIN 500 MG PO CAPS: 500.0000 mg | ORAL_CAPSULE | Freq: Three times a day (TID) | ORAL | 0 refills | Status: AC

## 2021-07-25 NOTE — Telephone Encounter (Signed)
Patient unable to find her prescription.

## 2021-08-22 ENCOUNTER — Encounter (HOSPITAL_COMMUNITY): Payer: Self-pay | Admitting: Radiology

## 2021-12-16 ENCOUNTER — Emergency Department (HOSPITAL_COMMUNITY): Payer: Medicare Other

## 2021-12-16 ENCOUNTER — Other Ambulatory Visit: Payer: Self-pay

## 2021-12-16 ENCOUNTER — Encounter (HOSPITAL_COMMUNITY): Payer: Self-pay | Admitting: Emergency Medicine

## 2021-12-16 ENCOUNTER — Inpatient Hospital Stay (HOSPITAL_COMMUNITY)
Admission: EM | Admit: 2021-12-16 | Discharge: 2021-12-21 | DRG: 521 | Disposition: A | Discharge status: Skilled Nursing Facility | Payer: Medicare Other | Source: Skilled Nursing Facility | Attending: Family Medicine | Admitting: Family Medicine

## 2021-12-16 DIAGNOSIS — R9431 Abnormal electrocardiogram [ECG] [EKG]: Secondary | ICD-10-CM | POA: Diagnosis present

## 2021-12-16 DIAGNOSIS — R54 Age-related physical debility: Secondary | ICD-10-CM | POA: Diagnosis present

## 2021-12-16 DIAGNOSIS — Z85828 Personal history of other malignant neoplasm of skin: Secondary | ICD-10-CM | POA: Diagnosis not present

## 2021-12-16 DIAGNOSIS — R5381 Other malaise: Secondary | ICD-10-CM | POA: Diagnosis present

## 2021-12-16 DIAGNOSIS — R079 Chest pain, unspecified: Secondary | ICD-10-CM | POA: Diagnosis not present

## 2021-12-16 DIAGNOSIS — E8809 Other disorders of plasma-protein metabolism, not elsewhere classified: Secondary | ICD-10-CM | POA: Diagnosis present

## 2021-12-16 DIAGNOSIS — Z87891 Personal history of nicotine dependence: Secondary | ICD-10-CM

## 2021-12-16 DIAGNOSIS — F0393 Unspecified dementia, unspecified severity, with mood disturbance: Secondary | ICD-10-CM | POA: Diagnosis present

## 2021-12-16 DIAGNOSIS — R1310 Dysphagia, unspecified: Secondary | ICD-10-CM | POA: Diagnosis present

## 2021-12-16 DIAGNOSIS — Z79890 Hormone replacement therapy (postmenopausal): Secondary | ICD-10-CM

## 2021-12-16 DIAGNOSIS — Z91041 Radiographic dye allergy status: Secondary | ICD-10-CM

## 2021-12-16 DIAGNOSIS — J9601 Acute respiratory failure with hypoxia: Secondary | ICD-10-CM | POA: Diagnosis present

## 2021-12-16 DIAGNOSIS — D62 Acute posthemorrhagic anemia: Secondary | ICD-10-CM | POA: Diagnosis not present

## 2021-12-16 DIAGNOSIS — R296 Repeated falls: Secondary | ICD-10-CM | POA: Diagnosis present

## 2021-12-16 DIAGNOSIS — Z79899 Other long term (current) drug therapy: Secondary | ICD-10-CM

## 2021-12-16 DIAGNOSIS — Z7982 Long term (current) use of aspirin: Secondary | ICD-10-CM | POA: Diagnosis not present

## 2021-12-16 DIAGNOSIS — Y92129 Unspecified place in nursing home as the place of occurrence of the external cause: Secondary | ICD-10-CM

## 2021-12-16 DIAGNOSIS — K219 Gastro-esophageal reflux disease without esophagitis: Secondary | ICD-10-CM | POA: Diagnosis not present

## 2021-12-16 DIAGNOSIS — E039 Hypothyroidism, unspecified: Secondary | ICD-10-CM | POA: Diagnosis present

## 2021-12-16 DIAGNOSIS — E785 Hyperlipidemia, unspecified: Secondary | ICD-10-CM | POA: Diagnosis present

## 2021-12-16 DIAGNOSIS — S72011A Unspecified intracapsular fracture of right femur, initial encounter for closed fracture: Secondary | ICD-10-CM | POA: Diagnosis present

## 2021-12-16 DIAGNOSIS — S72001S Fracture of unspecified part of neck of right femur, sequela: Secondary | ICD-10-CM | POA: Diagnosis not present

## 2021-12-16 DIAGNOSIS — N1831 Chronic kidney disease, stage 3a: Secondary | ICD-10-CM | POA: Diagnosis present

## 2021-12-16 DIAGNOSIS — S72001A Fracture of unspecified part of neck of right femur, initial encounter for closed fracture: Secondary | ICD-10-CM | POA: Diagnosis not present

## 2021-12-16 DIAGNOSIS — R12 Heartburn: Secondary | ICD-10-CM | POA: Diagnosis not present

## 2021-12-16 DIAGNOSIS — Z888 Allergy status to other drugs, medicaments and biological substances status: Secondary | ICD-10-CM

## 2021-12-16 DIAGNOSIS — E860 Dehydration: Secondary | ICD-10-CM | POA: Diagnosis present

## 2021-12-16 DIAGNOSIS — I129 Hypertensive chronic kidney disease with stage 1 through stage 4 chronic kidney disease, or unspecified chronic kidney disease: Secondary | ICD-10-CM | POA: Diagnosis present

## 2021-12-16 DIAGNOSIS — F0394 Unspecified dementia, unspecified severity, with anxiety: Secondary | ICD-10-CM | POA: Diagnosis present

## 2021-12-16 DIAGNOSIS — E8770 Fluid overload, unspecified: Secondary | ICD-10-CM | POA: Diagnosis present

## 2021-12-16 DIAGNOSIS — W19XXXA Unspecified fall, initial encounter: Secondary | ICD-10-CM | POA: Diagnosis present

## 2021-12-16 DIAGNOSIS — F03918 Unspecified dementia, unspecified severity, with other behavioral disturbance: Secondary | ICD-10-CM | POA: Diagnosis present

## 2021-12-16 LAB — CBC WITH DIFFERENTIAL/PLATELET
Abs Immature Granulocytes: 0.14 10*3/uL — ABNORMAL HIGH (ref 0.00–0.07)
Basophils Absolute: 0.1 10*3/uL (ref 0.0–0.1)
Basophils Relative: 1 %
Eosinophils Absolute: 0.3 10*3/uL (ref 0.0–0.5)
Eosinophils Relative: 2 %
HCT: 41.8 % (ref 36.0–46.0)
Hemoglobin: 13.1 g/dL (ref 12.0–15.0)
Immature Granulocytes: 1 %
Lymphocytes Relative: 20 %
Lymphs Abs: 2.5 10*3/uL (ref 0.7–4.0)
MCH: 27 pg (ref 26.0–34.0)
MCHC: 31.3 g/dL (ref 30.0–36.0)
MCV: 86 fL (ref 80.0–100.0)
Monocytes Absolute: 0.9 10*3/uL (ref 0.1–1.0)
Monocytes Relative: 8 %
Neutro Abs: 8.4 10*3/uL — ABNORMAL HIGH (ref 1.7–7.7)
Neutrophils Relative %: 68 %
Platelets: 300 10*3/uL (ref 150–400)
RBC: 4.86 MIL/uL (ref 3.87–5.11)
RDW: 14.4 % (ref 11.5–15.5)
WBC: 12.4 10*3/uL — ABNORMAL HIGH (ref 4.0–10.5)
nRBC: 0 % (ref 0.0–0.2)

## 2021-12-16 LAB — BASIC METABOLIC PANEL
Anion gap: 7 (ref 5–15)
BUN: 32 mg/dL — ABNORMAL HIGH (ref 8–23)
CO2: 25 mmol/L (ref 22–32)
Calcium: 8.8 mg/dL — ABNORMAL LOW (ref 8.9–10.3)
Chloride: 107 mmol/L (ref 98–111)
Creatinine, Ser: 1.14 mg/dL — ABNORMAL HIGH (ref 0.44–1.00)
GFR, Estimated: 47 mL/min — ABNORMAL LOW (ref >60)
Glucose, Bld: 124 mg/dL — ABNORMAL HIGH (ref 70–99)
Potassium: 4.2 mmol/L (ref 3.5–5.1)
Sodium: 139 mmol/L (ref 135–145)

## 2021-12-16 MED ORDER — SODIUM CHLORIDE 0.9 % IV BOLUS
250.0000 mL | Freq: Once | INTRAVENOUS | Status: AC
  Administered 2021-12-16: 250 mL via INTRAVENOUS

## 2021-12-16 MED ORDER — MORPHINE SULFATE (PF) 2 MG/ML IV SOLN
0.5000 mg | INTRAVENOUS | Status: DC | PRN
  Administered 2021-12-17 (×3): 0.5 mg via INTRAVENOUS
  Filled 2021-12-16 (×3): qty 1

## 2021-12-16 MED ORDER — ENOXAPARIN SODIUM 30 MG/0.3ML IJ SOSY
30.0000 mg | PREFILLED_SYRINGE | INTRAMUSCULAR | Status: DC
  Administered 2021-12-18 – 2021-12-20 (×3): 30 mg via SUBCUTANEOUS
  Filled 2021-12-16 (×4): qty 0.3

## 2021-12-16 MED ORDER — HYDROCODONE-ACETAMINOPHEN 5-325 MG PO TABS
1.0000 | ORAL_TABLET | Freq: Four times a day (QID) | ORAL | Status: DC | PRN
  Filled 2021-12-16: qty 2

## 2021-12-16 MED ORDER — HYDROMORPHONE HCL 1 MG/ML IJ SOLN
0.5000 mg | Freq: Once | INTRAMUSCULAR | Status: AC
  Administered 2021-12-16: 0.5 mg via INTRAVENOUS
  Filled 2021-12-16: qty 1

## 2021-12-16 MED ORDER — HYDROMORPHONE HCL 1 MG/ML IJ SOLN
0.5000 mg | INTRAMUSCULAR | Status: AC
  Administered 2021-12-16: 0.5 mg via INTRAVENOUS
  Filled 2021-12-16: qty 0.5

## 2021-12-16 NOTE — ED Triage Notes (Addendum)
Per EMS, patient from [facility] memory care, unwitnessed fall fall. R hip pain. Oxygen desaturation with Fentanyl. ? ?24g R FA ?89mg Fentanyl with EMS ?

## 2021-12-16 NOTE — H&P (Addendum)
?History and Physical ? ?Sarah Acevedo MMN:[id number] DOB: 1933/05/19 DOA: 12/16/2021 ? ?Referring physician: Dr. Roderic Palau, [place]  ?PCP: Margretta Sidle, MD  ?Outpatient Specialists: Psychiatry ?Patient coming from: ALF/memory care. ? ?Chief Complaint: Unwitnessed fall ? ?HPI: Sarah Acevedo is a 88 y.o. female with medical history significant for dementia with behavioral disturbance, dysphagia, essential hypertension, hyperlipidemia, CKD 3A, hypothyroidism, who presented to [facility] ED via EMS after an unwitnessed fall.  History is obtained from her daughter at bedside.  She fell in the bathroom, and had been laying on the floor for a while.  Had another fall 2 days prior, unclear of the circumstances around that fall.  Per her daughter, her mentation has been going downhill for the past few weeks.  She has been having some hallucinations and her dose of olanzapine was adjusted by her psychiatrist.  Upon presentation to the ED, work-up revealed acute, displaced, externally rotated, right subcapital femoral neck fracture.  Head CT and CT cervical spine nonacute.  EDP discussed case with orthopedic surgery who recommended n.p.o. after midnight for possible repair in the morning.  Patient admitted by hospitalist service, [facility]. ? ?ED Course: Tmax 97.4.  BP 154/71, pulse 65, respiratory rate 16, saturation 96% on 4 L.  Lab studies remarkable for leukocytosis, WBC 12.4K, creatinine 1.14 with GFR 47 at baseline. ? ?Review of Systems: ?Review of systems as noted in the HPI. All other systems reviewed and are negative. ? ? ?Past Medical History:  ?Diagnosis Date  ? Anxiety   ? Basal cell carcinoma   ? Bone spur   ? Cancer [facility])   ? Depression   ? Hypertension   ? Hypothyroidism   ? Ruptured disk   ? ?Past Surgical History:  ?Procedure Laterality Date  ? CESAREAN SECTION    ? TMJ ARTHROPLASTY    ? TONSILLECTOMY    ? ? ?Social History:  reports that she has quit smoking. She has never used smokeless tobacco. She reports that she does not  drink alcohol and does not use drugs. ? ? ?Allergies  ?Allergen Reactions  ? Contrast Media [Iodinated Contrast Media] Hives  ?  Not listed on MAR  ? Iodine Hives  ?  Per MAR  ? ? ?Family history: ?Cancer, negative history. ? ?Prior to Admission medications   ?Medication Sig Start Date End Date Taking? Authorizing Provider  ?ACETAMINOPHEN EXTRA STRENGTH 500 MG capsule Take 500 mg by mouth every 4 (four) hours as needed for pain. 08/22/21  Yes [provider]  ?albuterol (PROVENTIL) (2.5 MG/3ML) 0.083% nebulizer solution Take 2.5 mg by nebulization every 4 (four) hours as needed for wheezing (coughing). 10/17/21  Yes [provider]  ?aluminum-magnesium hydroxide-simethicone (MAALOX) [phone] MG/5ML SUSP Take 30 mLs by mouth every 6 (six) hours as needed (for heartburn and indigestion).   Yes [provider]  ?aspirin 81 MG chewable tablet Chew 81 mg by mouth daily.   Yes [provider]  ?Cholecalciferol (VITAMIN D3) 50 MCG (2000 UT) TABS Take 2,000 Units by mouth daily.   Yes [provider]  ?donepezil (ARICEPT) 23 MG TABS tablet Take 23 mg by mouth daily. 12/12/21  Yes [provider]  ?famotidine (PEPCID) 20 MG tablet Take 20 mg by mouth daily.   Yes [provider]  ?guaiFENesin (ROBITUSSIN) 100 MG/5ML liquid Take 100 mg by mouth every 4 (four) hours as needed for cough.   Yes [provider]  ?hypromellose (GENTEAL) 0.3 % GEL ophthalmic ointment Place 1 application. into the right eye  3 (three) times daily.   Yes [provider]  ?levothyroxine (SYNTHROID, LEVOTHROID) 50 MCG tablet Take 50 mcg by mouth daily.   Yes [provider]  ?Melatonin 3 MG TABS Take 3 mg by mouth at bedtime. Take with '5mg'$  tablet for a total nightly dose of '8mg'$ .   Yes [provider]  ?metoprolol succinate (TOPROL-XL) 100 MG 24 hr tablet Take 100 mg by mouth daily. Take with or immediately following a meal.   Yes [provider]   ?OLANZapine (ZYPREXA) 20 MG tablet Take 10 mg by mouth at bedtime.   Yes [provider]  ?Pseudoephedrine-Guaifenesin (MUCINEX D MAX STRENGTH) [phone] MG TB12 Take 1 tablet by mouth 2 (two) times daily as needed (for cough and congestion).   Yes [provider]  ?sertraline (ZOLOFT) 100 MG tablet Take 100 mg by mouth daily.   Yes [provider]  ?traZODone (DESYREL) 100 MG tablet Take 100 mg by mouth at bedtime.   Yes [provider]  ? ? ?Physical Exam: ?BP (!) 154/71 (BP Location: Left Arm)   Pulse (!) 55   Temp 97.9 ?F (36.6 ?C) (Oral)   Resp 16   Ht '5\' 1"'$  (1.549 m)   Wt 52.5 kg   SpO2 96%   BMI 21.88 kg/m?  ? ?General: 88 y.o. year-old female frail-appearing in no acute distress.  Alert and pleasantly confused. ?Cardiovascular: Regular rate and rhythm with no rubs or gallops.  No thyromegaly or JVD noted.  No lower extremity edema. 2/4 pulses in all 4 extremities. ?Respiratory: Clear to auscultation with no wheezes or rales. Good inspiratory effort. ?Abdomen: Soft nontender nondistended with normal bowel sounds x4 quadrants. ?Muskuloskeletal: No cyanosis, clubbing or edema noted bilaterally.  Right lower extremity is externally rotated. ?Neuro: CN II-XII intact, strength, sensation, reflexes ?Skin: No ulcerative lesions noted or rashes ?Psychiatry: Judgement and insight appear altered. Mood is appropriate for condition and setting ?   ?   ?   ?Labs on Admission:  ?Basic Metabolic Panel: ?Recent Labs  ?Lab 12/16/21 ?5400  ?NA 139  ?K 4.2  ?CL 107  ?CO2 25  ?GLUCOSE 124*  ?BUN 32*  ?CREATININE 1.14*  ?CALCIUM 8.8*  ? ?Liver Function Tests: ?No results for input(s): AST, ALT, ALKPHOS, BILITOT, PROT, ALBUMIN in the last 168 hours. ?No results for input(s): LIPASE, AMYLASE in the last 168 hours. ?No results for input(s): AMMONIA in the last 168 hours. ?CBC: ?Recent Labs  ?Lab 12/16/21 ?8676  ?WBC 12.4*  ?NEUTROABS 8.4*  ?HGB 13.1  ?HCT 41.8  ?MCV 86.0  ?PLT 300   ? ?Cardiac Enzymes: ?No results for input(s): CKTOTAL, CKMB, CKMBINDEX, TROPONINI in the last 168 hours. ? ?BNP (last 3 results) ?No results for input(s): BNP in the last 8760 hours. ? ?ProBNP (last 3 results) ?No results for input(s): PROBNP in the last 8760 hours. ? ?CBG: ?No results for input(s): GLUCAP in the last 168 hours. ? ?Radiological Exams on Admission: ?CT Head Wo Contrast ? ?Result Date: 12/16/2021 ?CLINICAL DATA:  Un witnessed fall EXAM: CT HEAD WITHOUT CONTRAST TECHNIQUE: Contiguous axial images were obtained from the base of the skull through the vertex without intravenous contrast. RADIATION DOSE REDUCTION: This exam was performed according to the departmental dose-optimization program which includes automated exposure control, adjustment of the mA and/or kV according to patient size and/or use of iterative reconstruction technique. COMPARISON:  07/21/2021 FINDINGS: Brain: Scattered hypodensities throughout the periventricular white matter and bilateral basal ganglia are again noted, consistent with  stable chronic small vessel ischemic change. No evidence of acute infarct or hemorrhage. Lateral ventricles are unremarkable. 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 Previously this measured approximately 3.2 x 2.4 by 2.4 cm by my measurement. No acute extra-axial fluid collections. Vascular: Stable atherosclerosis. Skull: Minimal right parietal scalp hematoma. No underlying fracture. The remainder of the calvarium is unremarkable. Sinuses/Orbits: No acute finding. Other: None. IMPRESSION: 1. No acute infarct or hemorrhage. 2. Small right parietal scalp hematoma.  No underlying fracture. 3. Stable mass centered in the right cavernous sinus, which may reflect meningioma or cavernous carotid aneurysm. 4. Stable chronic small-vessel ischemic changes throughout the white matter. Electronically Signed   By: Randa Ngo M.D.   On: 12/16/2021 18:53  ? ?CT Cervical Spine Wo Contrast ? ?Result Date: 12/16/2021 ?CLINICAL DATA:  Ataxia, cervical trauma.  Unwitnessed fall. EXAM: CT CERVICAL SPINE WITHOUT CONTRAST TECHNIQUE: Multidetector CT

## 2021-12-17 ENCOUNTER — Inpatient Hospital Stay (HOSPITAL_COMMUNITY): Payer: Medicare Other | Admitting: Anesthesiology

## 2021-12-17 ENCOUNTER — Encounter (HOSPITAL_COMMUNITY): Payer: Self-pay | Admitting: Internal Medicine

## 2021-12-17 ENCOUNTER — Encounter (HOSPITAL_COMMUNITY)
Admission: EM | Disposition: A | Discharge status: Skilled Nursing Facility | Payer: Self-pay | Source: Skilled Nursing Facility | Attending: Family Medicine

## 2021-12-17 ENCOUNTER — Inpatient Hospital Stay (HOSPITAL_COMMUNITY): Payer: Medicare Other

## 2021-12-17 DIAGNOSIS — R9431 Abnormal electrocardiogram [ECG] [EKG]: Secondary | ICD-10-CM

## 2021-12-17 DIAGNOSIS — S72001A Fracture of unspecified part of neck of right femur, initial encounter for closed fracture: Secondary | ICD-10-CM

## 2021-12-17 DIAGNOSIS — J9601 Acute respiratory failure with hypoxia: Secondary | ICD-10-CM

## 2021-12-17 DIAGNOSIS — N1831 Chronic kidney disease, stage 3a: Secondary | ICD-10-CM

## 2021-12-17 DIAGNOSIS — E039 Hypothyroidism, unspecified: Secondary | ICD-10-CM

## 2021-12-17 DIAGNOSIS — I129 Hypertensive chronic kidney disease with stage 1 through stage 4 chronic kidney disease, or unspecified chronic kidney disease: Secondary | ICD-10-CM

## 2021-12-17 HISTORY — PX: HIP ARTHROPLASTY: SHX981

## 2021-12-17 LAB — URINALYSIS, ROUTINE W REFLEX MICROSCOPIC
Bilirubin Urine: NEGATIVE
Glucose, UA: NEGATIVE mg/dL
Hgb urine dipstick: NEGATIVE
Ketones, ur: NEGATIVE mg/dL
Leukocytes,Ua: NEGATIVE
Nitrite: NEGATIVE
Protein, ur: NEGATIVE mg/dL
Specific Gravity, Urine: 1.025 (ref 1.005–1.030)
pH: 5 (ref 5.0–8.0)

## 2021-12-17 LAB — GLUCOSE, CAPILLARY: Glucose-Capillary: 165 mg/dL — ABNORMAL HIGH (ref 70–99)

## 2021-12-17 SURGERY — HEMIARTHROPLASTY, HIP, DIRECT ANTERIOR APPROACH, FOR FRACTURE
Anesthesia: General | Site: Hip | Laterality: Right

## 2021-12-17 MED ORDER — PSEUDOEPHEDRINE-GUAIFENESIN ER 120-1200 MG PO TB12: 1.0000 | ORAL_TABLET | Freq: Two times a day (BID) | ORAL | Status: DC | PRN

## 2021-12-17 MED ORDER — FENTANYL CITRATE PF 50 MCG/ML IJ SOSY
PREFILLED_SYRINGE | INTRAMUSCULAR | Status: AC
Start: 2021-12-17 — End: 2021-12-18
  Filled 2021-12-17: qty 1

## 2021-12-17 MED ORDER — FENTANYL CITRATE (PF) 100 MCG/2ML IJ SOLN
INTRAMUSCULAR | Status: AC
Start: 2021-12-17 — End: ?
  Filled 2021-12-17: qty 2

## 2021-12-17 MED ORDER — OLANZAPINE 10 MG PO TABS
10.0000 mg | ORAL_TABLET | Freq: Every day | ORAL | Status: DC
  Administered 2021-12-17 – 2021-12-18 (×2): 10 mg via ORAL
  Filled 2021-12-17 (×3): qty 1

## 2021-12-17 MED ORDER — EPHEDRINE 5 MG/ML INJ
INTRAVENOUS | Status: AC
  Filled 2021-12-17: qty 5

## 2021-12-17 MED ORDER — TRAZODONE HCL 100 MG PO TABS
100.0000 mg | ORAL_TABLET | Freq: Every day | ORAL | Status: DC
  Administered 2021-12-18 – 2021-12-20 (×3): 100 mg via ORAL
  Filled 2021-12-17 (×3): qty 1

## 2021-12-17 MED ORDER — ASPIRIN 325 MG PO TABS: 325.0000 mg | ORAL_TABLET | Freq: Every day | ORAL | Status: AC

## 2021-12-17 MED ORDER — POVIDONE-IODINE 10 % EX SWAB: 2.0000 "application " | Freq: Once | CUTANEOUS | Status: DC

## 2021-12-17 MED ORDER — SERTRALINE HCL 100 MG PO TABS
100.0000 mg | ORAL_TABLET | Freq: Every day | ORAL | Status: DC
  Administered 2021-12-17 – 2021-12-21 (×5): 100 mg via ORAL
  Filled 2021-12-17 (×5): qty 1

## 2021-12-17 MED ORDER — ARTIFICIAL TEARS OPHTHALMIC OINT
TOPICAL_OINTMENT | Freq: Three times a day (TID) | OPHTHALMIC | Status: DC
Start: 2021-12-17 — End: 2021-12-21
  Administered 2021-12-17 – 2021-12-21 (×3): 1 via OPHTHALMIC
  Filled 2021-12-17: qty 3.5

## 2021-12-17 MED ORDER — PHENYLEPHRINE HCL-NACL 20-0.9 MG/250ML-% IV SOLN
INTRAVENOUS | Status: DC | PRN
  Administered 2021-12-17: 25 ug/min via INTRAVENOUS

## 2021-12-17 MED ORDER — LEVOTHYROXINE SODIUM 50 MCG PO TABS
50.0000 ug | ORAL_TABLET | Freq: Every day | ORAL | Status: DC
  Administered 2021-12-17 – 2021-12-21 (×5): 50 ug via ORAL
  Filled 2021-12-17 (×5): qty 1

## 2021-12-17 MED ORDER — BUPIVACAINE HCL (PF) 0.5 % IJ SOLN
INTRAMUSCULAR | Status: AC
Start: 2021-12-17 — End: ?
  Filled 2021-12-17: qty 30

## 2021-12-17 MED ORDER — ALBUTEROL SULFATE (2.5 MG/3ML) 0.083% IN NEBU: 2.5000 mg | INHALATION_SOLUTION | RESPIRATORY_TRACT | Status: DC | PRN

## 2021-12-17 MED ORDER — TRANEXAMIC ACID-NACL 1000-0.7 MG/100ML-% IV SOLN
1000.0000 mg | Freq: Once | INTRAVENOUS | Status: AC
  Administered 2021-12-17: 1000 mg via INTRAVENOUS
  Filled 2021-12-17: qty 100

## 2021-12-17 MED ORDER — DEXAMETHASONE SODIUM PHOSPHATE 10 MG/ML IJ SOLN
INTRAMUSCULAR | Status: DC | PRN
  Administered 2021-12-17: 5 mg

## 2021-12-17 MED ORDER — ROCURONIUM BROMIDE 10 MG/ML (PF) SYRINGE
PREFILLED_SYRINGE | INTRAVENOUS | Status: AC
  Filled 2021-12-17: qty 10

## 2021-12-17 MED ORDER — TRANEXAMIC ACID-NACL 1000-0.7 MG/100ML-% IV SOLN
INTRAVENOUS | Status: AC
  Filled 2021-12-17: qty 100

## 2021-12-17 MED ORDER — ALUM & MAG HYDROXIDE-SIMETH 200-200-20 MG/5ML PO SUSP
30.0000 mL | Freq: Four times a day (QID) | ORAL | Status: DC | PRN
  Administered 2021-12-19: 30 mL via ORAL
  Filled 2021-12-17: qty 30

## 2021-12-17 MED ORDER — BUPIVACAINE-EPINEPHRINE 0.25% -1:200000 IJ SOLN
INTRAMUSCULAR | Status: AC
  Filled 2021-12-17: qty 1

## 2021-12-17 MED ORDER — CEFAZOLIN SODIUM-DEXTROSE 2-4 GM/100ML-% IV SOLN
INTRAVENOUS | Status: AC
  Filled 2021-12-17: qty 100

## 2021-12-17 MED ORDER — ONDANSETRON HCL 4 MG/2ML IJ SOLN: 4.0000 mg | Freq: Four times a day (QID) | INTRAMUSCULAR | Status: DC | PRN

## 2021-12-17 MED ORDER — LACTATED RINGERS IV BOLUS
500.0000 mL | Freq: Once | INTRAVENOUS | Status: AC
  Administered 2021-12-17: 500 mL via INTRAVENOUS

## 2021-12-17 MED ORDER — LACTATED RINGERS IV SOLN: INTRAVENOUS | Status: DC | PRN

## 2021-12-17 MED ORDER — PSEUDOEPHEDRINE HCL ER 120 MG PO TB12
120.0000 mg | ORAL_TABLET | Freq: Two times a day (BID) | ORAL | Status: DC | PRN
  Administered 2021-12-19: 120 mg via ORAL
  Filled 2021-12-17 (×2): qty 1

## 2021-12-17 MED ORDER — VITAMIN D 25 MCG (1000 UNIT) PO TABS
2000.0000 [IU] | ORAL_TABLET | Freq: Every day | ORAL | Status: DC
  Administered 2021-12-18 – 2021-12-21 (×4): 2000 [IU] via ORAL
  Filled 2021-12-17 (×4): qty 2

## 2021-12-17 MED ORDER — MELATONIN 3 MG PO TABS
3.0000 mg | ORAL_TABLET | Freq: Every day | ORAL | Status: DC
  Administered 2021-12-18 – 2021-12-20 (×3): 3 mg via ORAL
  Filled 2021-12-17 (×4): qty 1

## 2021-12-17 MED ORDER — PHENOL 1.4 % MT LIQD: 1.0000 | OROMUCOSAL | Status: DC | PRN

## 2021-12-17 MED ORDER — MENTHOL 3 MG MT LOZG: 1.0000 | LOZENGE | OROMUCOSAL | Status: DC | PRN

## 2021-12-17 MED ORDER — SUCCINYLCHOLINE CHLORIDE 200 MG/10ML IV SOSY
PREFILLED_SYRINGE | INTRAVENOUS | Status: AC
  Filled 2021-12-17: qty 10

## 2021-12-17 MED ORDER — ROPIVACAINE HCL 5 MG/ML IJ SOLN
INTRAMUSCULAR | Status: DC | PRN
  Administered 2021-12-17: 20 mL via PERINEURAL

## 2021-12-17 MED ORDER — HYPROMELLOSE 0.3 % OP GEL: 1.0000 "application " | Freq: Three times a day (TID) | OPHTHALMIC | Status: DC

## 2021-12-17 MED ORDER — BISACODYL 10 MG RE SUPP: 10.0000 mg | Freq: Every day | RECTAL | Status: DC | PRN

## 2021-12-17 MED ORDER — TRANEXAMIC ACID-NACL 1000-0.7 MG/100ML-% IV SOLN
INTRAVENOUS | Status: DC | PRN
  Administered 2021-12-17: 1000 mg via INTRAVENOUS

## 2021-12-17 MED ORDER — ALBUMIN HUMAN 5 % IV SOLN
12.5000 g | Freq: Once | INTRAVENOUS | Status: AC
Start: 2021-12-17 — End: 2021-12-17

## 2021-12-17 MED ORDER — ONDANSETRON HCL 4 MG/2ML IJ SOLN
INTRAMUSCULAR | Status: DC | PRN
  Administered 2021-12-17: 4 mg via INTRAVENOUS

## 2021-12-17 MED ORDER — FENTANYL CITRATE PF 50 MCG/ML IJ SOSY
25.0000 ug | PREFILLED_SYRINGE | Freq: Once | INTRAMUSCULAR | Status: AC
  Administered 2021-12-17: 25 ug via INTRAVENOUS

## 2021-12-17 MED ORDER — PROCHLORPERAZINE EDISYLATE 10 MG/2ML IJ SOLN
10.0000 mg | Freq: Four times a day (QID) | INTRAMUSCULAR | Status: DC | PRN
  Administered 2021-12-17: 10 mg via INTRAVENOUS
  Filled 2021-12-17: qty 2

## 2021-12-17 MED ORDER — LACTATED RINGERS IV SOLN: INTRAVENOUS | Status: DC

## 2021-12-17 MED ORDER — DEXAMETHASONE SODIUM PHOSPHATE 10 MG/ML IJ SOLN
INTRAMUSCULAR | Status: DC | PRN
  Administered 2021-12-17: 5 mg via INTRAVENOUS

## 2021-12-17 MED ORDER — SUGAMMADEX SODIUM 200 MG/2ML IV SOLN
INTRAVENOUS | Status: DC | PRN
  Administered 2021-12-17: 200 mg via INTRAVENOUS

## 2021-12-17 MED ORDER — BUPIVACAINE HCL 0.25 % IJ SOLN
INTRAMUSCULAR | Status: AC
  Filled 2021-12-17: qty 1

## 2021-12-17 MED ORDER — ACETAMINOPHEN 500 MG PO TABS: 500.0000 mg | ORAL_TABLET | ORAL | Status: DC | PRN

## 2021-12-17 MED ORDER — PHENYLEPHRINE HCL (PRESSORS) 10 MG/ML IV SOLN
INTRAVENOUS | Status: AC
  Filled 2021-12-17: qty 1

## 2021-12-17 MED ORDER — FENTANYL CITRATE (PF) 100 MCG/2ML IJ SOLN
INTRAMUSCULAR | Status: DC | PRN
  Administered 2021-12-17: 100 ug via INTRAVENOUS

## 2021-12-17 MED ORDER — 0.9 % SODIUM CHLORIDE (POUR BTL) OPTIME
TOPICAL | Status: DC | PRN
  Administered 2021-12-17: 1000 mL

## 2021-12-17 MED ORDER — PROPOFOL 10 MG/ML IV BOLUS
INTRAVENOUS | Status: DC | PRN
  Administered 2021-12-17: 60 mg via INTRAVENOUS

## 2021-12-17 MED ORDER — LIDOCAINE 2% (20 MG/ML) 5 ML SYRINGE
INTRAMUSCULAR | Status: DC | PRN
Start: 2021-12-17 — End: 2021-12-17
  Administered 2021-12-17: 60 mg via INTRAVENOUS

## 2021-12-17 MED ORDER — ACETAMINOPHEN 10 MG/ML IV SOLN
1000.0000 mg | Freq: Once | INTRAVENOUS | Status: AC
  Administered 2021-12-18: 1000 mg via INTRAVENOUS
  Filled 2021-12-17: qty 100

## 2021-12-17 MED ORDER — METOCLOPRAMIDE HCL 5 MG/ML IJ SOLN: 5.0000 mg | Freq: Three times a day (TID) | INTRAMUSCULAR | Status: DC | PRN

## 2021-12-17 MED ORDER — DEXAMETHASONE SODIUM PHOSPHATE 10 MG/ML IJ SOLN
INTRAMUSCULAR | Status: AC
Start: 2021-12-17 — End: ?
  Filled 2021-12-17: qty 1

## 2021-12-17 MED ORDER — MORPHINE SULFATE (PF) 2 MG/ML IV SOLN
0.5000 mg | INTRAVENOUS | Status: DC | PRN
  Administered 2021-12-18: 1 mg via INTRAVENOUS
  Filled 2021-12-17: qty 1

## 2021-12-17 MED ORDER — CHLORHEXIDINE GLUCONATE 4 % EX LIQD: 60.0000 mL | Freq: Once | CUTANEOUS | Status: DC

## 2021-12-17 MED ORDER — METOCLOPRAMIDE HCL 5 MG PO TABS: 5.0000 mg | ORAL_TABLET | Freq: Three times a day (TID) | ORAL | Status: DC | PRN

## 2021-12-17 MED ORDER — GUAIFENESIN ER 600 MG PO TB12: 1200.0000 mg | ORAL_TABLET | Freq: Two times a day (BID) | ORAL | Status: DC | PRN

## 2021-12-17 MED ORDER — BUPIVACAINE HCL 0.25 % IJ SOLN
INTRAMUSCULAR | Status: DC | PRN
  Administered 2021-12-17: 10 mL

## 2021-12-17 MED ORDER — FAMOTIDINE 20 MG PO TABS
20.0000 mg | ORAL_TABLET | Freq: Every day | ORAL | Status: DC
  Administered 2021-12-18 – 2021-12-21 (×4): 20 mg via ORAL
  Filled 2021-12-17 (×4): qty 1

## 2021-12-17 MED ORDER — PROPOFOL 10 MG/ML IV BOLUS
INTRAVENOUS | Status: AC
  Filled 2021-12-17: qty 20

## 2021-12-17 MED ORDER — HYDROCODONE-ACETAMINOPHEN 7.5-325 MG PO TABS: 1.0000 | ORAL_TABLET | ORAL | Status: DC | PRN

## 2021-12-17 MED ORDER — ACETAMINOPHEN 500 MG PO TABS
500.0000 mg | ORAL_TABLET | Freq: Four times a day (QID) | ORAL | Status: AC
  Administered 2021-12-18 (×3): 500 mg via ORAL
  Filled 2021-12-17 (×3): qty 1

## 2021-12-17 MED ORDER — CEFAZOLIN SODIUM-DEXTROSE 2-4 GM/100ML-% IV SOLN
2.0000 g | INTRAVENOUS | Status: AC
  Administered 2021-12-17: 2 g via INTRAVENOUS

## 2021-12-17 MED ORDER — ONDANSETRON HCL 4 MG PO TABS: 4.0000 mg | ORAL_TABLET | Freq: Four times a day (QID) | ORAL | Status: DC | PRN

## 2021-12-17 MED ORDER — HYDROCODONE-ACETAMINOPHEN 5-325 MG PO TABS
1.0000 | ORAL_TABLET | ORAL | Status: DC | PRN
  Administered 2021-12-18 – 2021-12-21 (×7): 1 via ORAL
  Filled 2021-12-17 (×8): qty 1

## 2021-12-17 MED ORDER — METOPROLOL SUCCINATE ER 50 MG PO TB24
100.0000 mg | ORAL_TABLET | Freq: Every day | ORAL | Status: DC
  Administered 2021-12-18: 100 mg via ORAL
  Filled 2021-12-17 (×2): qty 2

## 2021-12-17 MED ORDER — PHENYLEPHRINE 40 MCG/ML (10ML) SYRINGE FOR IV PUSH (FOR BLOOD PRESSURE SUPPORT)
PREFILLED_SYRINGE | INTRAVENOUS | Status: DC | PRN
  Administered 2021-12-17: 240 ug via INTRAVENOUS

## 2021-12-17 MED ORDER — ROCURONIUM BROMIDE 10 MG/ML (PF) SYRINGE
PREFILLED_SYRINGE | INTRAVENOUS | Status: DC | PRN
Start: 2021-12-17 — End: 2021-12-17
  Administered 2021-12-17: 60 mg via INTRAVENOUS

## 2021-12-17 MED ORDER — ASPIRIN EC 325 MG PO TBEC
325.0000 mg | DELAYED_RELEASE_TABLET | Freq: Every day | ORAL | Status: DC
  Administered 2021-12-18 – 2021-12-21 (×4): 325 mg via ORAL
  Filled 2021-12-17 (×4): qty 1

## 2021-12-17 MED ORDER — ACETAMINOPHEN 325 MG PO TABS: 325.0000 mg | ORAL_TABLET | Freq: Four times a day (QID) | ORAL | Status: DC | PRN

## 2021-12-17 MED ORDER — DONEPEZIL HCL 23 MG PO TABS
23.0000 mg | ORAL_TABLET | Freq: Every day | ORAL | Status: DC
  Administered 2021-12-18 – 2021-12-20 (×3): 23 mg via ORAL
  Filled 2021-12-17 (×4): qty 1

## 2021-12-17 MED ORDER — HYDROCODONE-ACETAMINOPHEN 5-325 MG PO TABS
1.0000 | ORAL_TABLET | ORAL | 0 refills | Status: AC | PRN
Start: 2021-12-17 — End: 2022-12-17

## 2021-12-17 MED ORDER — PHENYLEPHRINE 40 MCG/ML (10ML) SYRINGE FOR IV PUSH (FOR BLOOD PRESSURE SUPPORT)
PREFILLED_SYRINGE | INTRAVENOUS | Status: AC
  Filled 2021-12-17: qty 10

## 2021-12-17 MED ORDER — GUAIFENESIN 100 MG/5ML PO LIQD: 100.0000 mg | ORAL | Status: DC | PRN

## 2021-12-17 MED ORDER — DOCUSATE SODIUM 100 MG PO CAPS
100.0000 mg | ORAL_CAPSULE | Freq: Two times a day (BID) | ORAL | Status: DC
  Administered 2021-12-18 – 2021-12-21 (×7): 100 mg via ORAL
  Filled 2021-12-17 (×7): qty 1

## 2021-12-17 MED ORDER — ALBUMIN HUMAN 5 % IV SOLN
INTRAVENOUS | Status: AC
  Administered 2021-12-17: 12.5 g via INTRAVENOUS
  Filled 2021-12-17: qty 250

## 2021-12-17 MED ORDER — ONDANSETRON HCL 4 MG/2ML IJ SOLN
INTRAMUSCULAR | Status: AC
  Filled 2021-12-17: qty 4

## 2021-12-17 SURGICAL SUPPLY — 60 items
BAG COUNTER SPONGE SURGICOUNT (BAG) IMPLANT
BIPOLAR PROS AML 44 (Hips) ×2 IMPLANT
BLADE SAW SAG 73X25 THK (BLADE) ×1
BLADE SAW SGTL 73X25 THK (BLADE) ×1 IMPLANT
BRUSH FEMORAL CANAL (MISCELLANEOUS) IMPLANT
CHLORAPREP W/TINT 26 (MISCELLANEOUS) ×2 IMPLANT
COVER SURGICAL LIGHT HANDLE (MISCELLANEOUS) ×2 IMPLANT
DRAPE INCISE IOBAN 66X45 STRL (DRAPES) ×2 IMPLANT
DRAPE ORTHO SPLIT 77X108 STRL (DRAPES) ×2
DRAPE POUCH INSTRU U-SHP 10X18 (DRAPES) ×2 IMPLANT
DRAPE SURG ORHT 6 SPLT 77X108 (DRAPES) ×2 IMPLANT
DRAPE U-SHAPE 47X51 STRL (DRAPES) ×2 IMPLANT
DRAPE WARM FLUID 44X44 (DRAPES) ×2 IMPLANT
DRSG MEPILEX BORDER 4X8 (GAUZE/BANDAGES/DRESSINGS) ×1 IMPLANT
DRSG PAD ABDOMINAL 8X10 ST (GAUZE/BANDAGES/DRESSINGS) ×1 IMPLANT
ELECT BLADE TIP CTD 4 INCH (ELECTRODE) ×2 IMPLANT
ELECT REM PT RETURN 15FT ADLT (MISCELLANEOUS) ×2 IMPLANT
EVACUATOR 1/8 PVC DRAIN (DRAIN) ×2 IMPLANT
FACESHIELD WRAPAROUND (MASK) ×10 IMPLANT
FACESHIELD WRAPAROUND OR TEAM (MASK) ×5 IMPLANT
GAUZE SPONGE 4X4 12PLY STRL (GAUZE/BANDAGES/DRESSINGS) ×3 IMPLANT
GAUZE XEROFORM 5X9 LF (GAUZE/BANDAGES/DRESSINGS) ×2 IMPLANT
GLOVE BIOGEL PI IND STRL 8 (GLOVE) ×1 IMPLANT
GLOVE BIOGEL PI INDICATOR 8 (GLOVE) ×1
GLOVE ORTHO TXT STRL SZ7.5 (GLOVE) ×2 IMPLANT
HANDPIECE INTERPULSE COAX TIP (DISPOSABLE)
HEAD BIPOLAR PROS AML 44 (Hips) IMPLANT
HEAD FEM STD 28X+1.5 STRL (Hips) ×1 IMPLANT
IMMOBILIZER KNEE 20 (SOFTGOODS) ×2 IMPLANT
IMMOBILIZER KNEE 20 THIGH 36 (SOFTGOODS) ×1 IMPLANT
KIT BASIN OR (CUSTOM PROCEDURE TRAY) ×2 IMPLANT
KIT TURNOVER KIT A (KITS) IMPLANT
NDL MAYO CATGUT SZ4 TPR NDL (NEEDLE) ×1 IMPLANT
NEEDLE HYPO 22GX1.5 SAFETY (NEEDLE) IMPLANT
NEEDLE MAYO CATGUT SZ4 (NEEDLE) ×2 IMPLANT
NS IRRIG 1000ML POUR BTL (IV SOLUTION) ×4 IMPLANT
PACK TOTAL JOINT (CUSTOM PROCEDURE TRAY) ×2 IMPLANT
PASSER SUT SWANSON 36MM LOOP (INSTRUMENTS) ×2 IMPLANT
PROTECTOR NERVE ULNAR (MISCELLANEOUS) ×2 IMPLANT
SET HNDPC FAN SPRY TIP SCT (DISPOSABLE) IMPLANT
SPONGE T-LAP 18X18 ~~LOC~~+RFID (SPONGE) IMPLANT
STAPLER VISISTAT 35W (STAPLE) ×2 IMPLANT
STEM SUMMIT BASIC PRESSFIT SZ3 (Hips) IMPLANT
SUCTION FRAZIER HANDLE 12FR (TUBING) ×1
SUCTION TUBE FRAZIER 12FR DISP (TUBING) ×1 IMPLANT
SUMMIT BASIC PRESSFIT SZ3 (Hips) ×2 IMPLANT
SUT ETHIBOND NAB CT1 #1 30IN (SUTURE) ×8 IMPLANT
SUT VIC AB 0 CT1 27 (SUTURE) ×1
SUT VIC AB 0 CT1 27XBRD ANTBC (SUTURE) ×1 IMPLANT
SUT VIC AB 1 CT1 27 (SUTURE)
SUT VIC AB 1 CT1 27XBRD ANTBC (SUTURE) IMPLANT
SUT VIC AB 1 CTX 36 (SUTURE)
SUT VIC AB 1 CTX36XBRD ANBCTR (SUTURE) IMPLANT
SUT VIC AB 2-0 CT1 36 (SUTURE) ×6 IMPLANT
SYR 30ML LL (SYRINGE) IMPLANT
TAPE CLOTH 4X10 WHT NS (GAUZE/BANDAGES/DRESSINGS) ×1 IMPLANT
TOWEL OR 17X26 10 PK STRL BLUE (TOWEL DISPOSABLE) ×4 IMPLANT
TOWER CARTRIDGE SMART MIX (DISPOSABLE) IMPLANT
TRAY FOLEY MTR SLVR 16FR STAT (SET/KITS/TRAYS/PACK) ×2 IMPLANT
WATER STERILE IRR 1000ML POUR (IV SOLUTION) ×2 IMPLANT

## 2021-12-17 NOTE — Progress Notes (Signed)
PT Cancellation Note ? ?Patient Details ?Name: Sarah Acevedo ?MRN: [id number] ?DOB: 1932-12-08 ? ? ?Cancelled Treatment:    Reason Eval/Treat Not Completed: Patient at procedure or test/unavailable - awaiting orthopedic consult and possible surgery, will check back post-op.  ? ?Stacie Glaze, PT DPT ?Acute Rehabilitation Services ?Pager [phone]  ?Office [phone] ? ? ? ?[name] ?12/17/2021, 8:26 AM ?

## 2021-12-17 NOTE — OR PostOp (Incomplete)
PACU TO INPATIENT HANDOFF REPORT ? ?Name/Age/Gender ?Sarah Acevedo ?88 y.o. ?female ? ?Code Status ? ?  ?Code Status Orders  ?(From admission, onward)  ?  ? ? ?  ? ?  Start     Ordered  ? 12/16/21 2217  Full code  Continuous       ? 12/16/21 2217  ? ?  ?  ? ?  ? ?Code Status History   ? ? Date Active Date Inactive Code Status Order ID Comments User Context  ? 12/16/2021 2215 12/16/2021 2217 Full Code [id number]  Kayleen Memos, DO Inpatient  ? 03/22/2019 1905 03/26/2019 1921 DNR [id number]  Neena Rhymes, MD ED  ? 03/22/2019 1829 03/22/2019 1904 Full Code [id number]  Neena Rhymes, MD ED  ? 07/22/2014 0312 07/23/2014 1758 Full Code [id number]  Hoy Morn, MD ED  ? 07/16/2014 1543 07/18/2014 1818 Full Code [id number]  Carmin Muskrat, MD ED  ? ?  ? ?Advance Directive Documentation   ? ?Flowsheet Row Most Recent Value  ?Type of Advance Directive Healthcare Power of Attorney  ?Pre-existing out of facility DNR order (yellow form or pink MOST form) --  ?"MOST" Form in Place? --  ? ?  ? ? ?Home/SNF/Other ?{Discharge Destination:18313::"Home"} ? ?Chief Complaint ?Closed right hip fracture ([place]) [S72.001A] ? ?Level of Care/Admitting Diagnosis ?ED Disposition   ? ? ED Disposition  ?Admit  ? Condition  ?--  ? Comment  ?Hospital Area: [facility] [[id number]] ? Level of Care: Telemetry [5] ? Admit to tele based on following criteria: Monitor for Ischemic changes ? May admit patient to [facility] or [facility] if equivalent level of care is available:: Yes ? Covid Evaluation: Asymptomatic - no recent exposure (last 10 days) testing not required ? Diagnosis: Closed right hip fracture ([place]) [500938] ? Admitting Physician: Kayleen Memos [[id number]] ? Attending Physician: Kayleen Memos [[id number]] ? Estimated length of stay: past midnight tomorrow ? Certification:: I certify this patient will need inpatient services for at least 2 midnights ?  ?  ? ?  ? ? ?Medical History ?Past Medical History:  ?Diagnosis Date  ?  Anxiety   ? Basal cell carcinoma   ? Bone spur   ? Cancer [facility])   ? Depression   ? Hypertension   ? Hypothyroidism   ? Ruptured disk   ? ? ?Allergies ?Allergies  ?Allergen Reactions  ? Contrast Media [Iodinated Contrast Media] Hives  ?  Not listed on MAR  ? Iodine Hives  ?  Per MAR  ? ? ?IV Location/Drains/Wounds ?Patient Lines/Drains/Airways Status   ? ? Active Line/Drains/Airways   ? ? Name Placement date Placement time Site Days  ? Peripheral IV 12/16/21 20 G Left;Posterior Forearm 12/16/21  1855  Forearm  1  ? Peripheral IV 12/16/21 24 G Anterior;Right Forearm 12/16/21  1800  Forearm  1  ? External Urinary Catheter 12/16/21  2230  --  1  ? Incision (Closed) 12/17/21 Hip 12/17/21  1832  -- less than 1  ? ?  ?  ? ?  ? ? ?Labs/Imaging ?Results for orders placed or performed during the hospital encounter of 12/16/21 (from the past 48 hour(s))  ?CBC with Differential     Status: Abnormal  ? Collection Time: 12/16/21  6:05 PM  ?Result Value Ref Range  ? WBC 12.4 (H) 4.0 - 10.5 K/uL  ? RBC 4.86 3.87 - 5.11 MIL/uL  ? Hemoglobin 13.1 12.0 - 15.0 g/dL  ? HCT 41.8  36.0 - 46.0 %  ? MCV 86.0 80.0 - 100.0 fL  ? MCH 27.0 26.0 - 34.0 pg  ? MCHC 31.3 30.0 - 36.0 g/dL  ? RDW 14.4 11.5 - 15.5 %  ? Platelets 300 150 - 400 K/uL  ? nRBC 0.0 0.0 - 0.2 %  ? Neutrophils Relative % 68 %  ? Neutro Abs 8.4 (H) 1.7 - 7.7 K/uL  ? Lymphocytes Relative 20 %  ? Lymphs Abs 2.5 0.7 - 4.0 K/uL  ? Monocytes Relative 8 %  ? Monocytes Absolute 0.9 0.1 - 1.0 K/uL  ? Eosinophils Relative 2 %  ? Eosinophils Absolute 0.3 0.0 - 0.5 K/uL  ? Basophils Relative 1 %  ? Basophils Absolute 0.1 0.0 - 0.1 K/uL  ? Immature Granulocytes 1 %  ? Abs Immature Granulocytes 0.14 (H) 0.00 - 0.07 K/uL  ?  Comment: Performed at [facility], [place] [address], [place], [place] [address]  ?Basic metabolic panel     Status: Abnormal  ? Collection Time: 12/16/21  6:05 PM  ?Result Value Ref Range  ? Sodium 139 135 - 145 mmol/L  ? Potassium 4.2 3.5 - 5.1 mmol/L   ? Chloride 107 98 - 111 mmol/L  ? CO2 25 22 - 32 mmol/L  ? Glucose, Bld 124 (H) 70 - 99 mg/dL  ?  Comment: Glucose reference range applies only to samples taken after fasting for at least 8 hours.  ? BUN 32 (H) 8 - 23 mg/dL  ? Creatinine, Ser 1.14 (H) 0.44 - 1.00 mg/dL  ? Calcium 8.8 (L) 8.9 - 10.3 mg/dL  ? GFR, Estimated 47 (L) >60 mL/min  ?  Comment: (NOTE) ?Calculated using the CKD-EPI Creatinine Equation (2021) ?  ? Anion gap 7 5 - 15  ?  Comment: Performed at [facility], [place] [address], [place], [place] [address]  ?Urinalysis, Routine w reflex microscopic Urine, Clean Catch     Status: Abnormal  ? Collection Time: 12/17/21  4:56 AM  ?Result Value Ref Range  ? Color, Urine YELLOW YELLOW  ? APPearance HAZY (A) CLEAR  ? Specific Gravity, Urine 1.025 1.005 - 1.030  ? pH 5.0 5.0 - 8.0  ? Glucose, UA NEGATIVE NEGATIVE mg/dL  ? Hgb urine dipstick NEGATIVE NEGATIVE  ? Bilirubin Urine NEGATIVE NEGATIVE  ? Ketones, ur NEGATIVE NEGATIVE mg/dL  ? Protein, ur NEGATIVE NEGATIVE mg/dL  ? Nitrite NEGATIVE NEGATIVE  ? Leukocytes,Ua NEGATIVE NEGATIVE  ?  Comment: Performed at [facility], [place] [address] [place], [place] [address]  ?Glucose, capillary     Status: Abnormal  ? Collection Time: 12/17/21  8:22 PM  ?Result Value Ref Range  ? Glucose-Capillary 165 (H) 70 - 99 mg/dL  ?  Comment: Glucose reference range applies only to samples taken after fasting for at least 8 hours.  ? Comment 1 Call MD NNP PA CNM   ? ?CT Head Wo Contrast ? ?Result Date: 12/16/2021 ?CLINICAL DATA:  Un witnessed fall EXAM: CT HEAD WITHOUT CONTRAST TECHNIQUE: Contiguous axial images were obtained from the base of the skull through the vertex without intravenous contrast. RADIATION DOSE REDUCTION: This exam was performed according to the departmental dose-optimization program which includes automated exposure control, adjustment of the mA and/or kV according to patient size and/or use of iterative reconstruction  technique. COMPARISON:  07/21/2021 FINDINGS: Brain: Scattered hypodensities throughout the periventricular white matter and bilateral basal ganglia are again noted, consistent with stable chronic small vessel ischemic change.  No evidence of acute infarct or hemorrhage. Lateral ventricles are unremarkable. 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 Previously this measured approximately 3.2 x 2.4 by 2.4 cm by my measurement. No acute extra-axial fluid collections. Vascular: Stable atherosclerosis. Skull: Minimal right parietal scalp hematoma. No underlying fracture. The remainder of the calvarium is unremarkable. Sinuses/Orbits: No acute finding. Other: None. IMPRESSION: 1. No acute infarct or hemorrhage. 2. Small right parietal scalp hematoma.  No underlying fracture. 3. Stable mass centered in the right cavernous sinus, which may reflect meningioma or cavernous carotid aneurysm. 4. Stable chronic small-vessel ischemic changes throughout the white matter. Electronically Signed   By: Randa Ngo M.D.   On: 12/16/2021 18:53  ? ?CT Cervical Spine Wo Contrast ? ?Result Date: 12/16/2021 ?CLINICAL DATA:  Ataxia, cervical trauma.  Unwitnessed fall. EXAM: CT CERVICAL SPINE WITHOUT CONTRAST TECHNIQUE: Multidetector CT imaging of the cervical spine was performed without intravenous contrast. Multiplanar CT image reconstructions were also generated. RADIATION DOSE REDUCTION: This exam was performed according to the departmental dose-optimization program which includes automated exposure control, adjustment of the mA and/or kV according to patient size and/or use of iterative reconstruction technique. COMPARISON:  None. FINDINGS: Alignment: Stable 3 mm anterolisthesis C5-6, likely degenerative in nature. Stable mild reversal of the normal cervical lordosis at C4-C7, again likely degenerative in nature. Skull base and vertebrae:  Craniocervical alignment is normal. The atlantodental interval is not widened. No acute fracture of the cervical spine. 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 Soft tissues and spinal canal: Rim calcified 2.7 cm mass is again

## 2021-12-17 NOTE — Plan of Care (Signed)

## 2021-12-17 NOTE — H&P (View-Only) (Signed)
? ? ?Reason for Consult: Fall with right displaced femoral neck fracture ?Referring Physician: Darrick Meigs ND ? ?Sarah Acevedo is an 88 y.o. female.  ?HPI: 88 year old female memory care patient with daughter who has power of attorney at bedside.  Patient's with inability to ambulate with right femoral neck fracture displaced.  She normally ambulates on her own in the memory care facility. ? ?Past Medical History:  ?Diagnosis Date  ? Anxiety   ? Basal cell carcinoma   ? Bone spur   ? Cancer [facility])   ? Depression   ? Hypertension   ? Hypothyroidism   ? Ruptured disk   ? ? ?Past Surgical History:  ?Procedure Laterality Date  ? CESAREAN SECTION    ? TMJ ARTHROPLASTY    ? TONSILLECTOMY    ? ? ?No family history on file. ? ?Social History:  reports that she has quit smoking. She has never used smokeless tobacco. She reports that she does not drink alcohol and does not use drugs. ? ?Allergies:  ?Allergies  ?Allergen Reactions  ? Contrast Media [Iodinated Contrast Media] Hives  ?  Not listed on MAR  ? Iodine Hives  ?  Per MAR  ? ? ?Medications: I have reviewed the patient's current medications. ? ?Results for orders placed or performed during the hospital encounter of 12/16/21 (from the past 48 hour(s))  ?CBC with Differential     Status: Abnormal  ? Collection Time: 12/16/21  6:05 PM  ?Result Value Ref Range  ? WBC 12.4 (H) 4.0 - 10.5 K/uL  ? RBC 4.86 3.87 - 5.11 MIL/uL  ? Hemoglobin 13.1 12.0 - 15.0 g/dL  ? HCT 41.8 36.0 - 46.0 %  ? MCV 86.0 80.0 - 100.0 fL  ? MCH 27.0 26.0 - 34.0 pg  ? MCHC 31.3 30.0 - 36.0 g/dL  ? RDW 14.4 11.5 - 15.5 %  ? Platelets 300 150 - 400 K/uL  ? nRBC 0.0 0.0 - 0.2 %  ? Neutrophils Relative % 68 %  ? Neutro Abs 8.4 (H) 1.7 - 7.7 K/uL  ? Lymphocytes Relative 20 %  ? Lymphs Abs 2.5 0.7 - 4.0 K/uL  ? Monocytes Relative 8 %  ? Monocytes Absolute 0.9 0.1 - 1.0 K/uL  ? Eosinophils Relative 2 %  ? Eosinophils Absolute 0.3 0.0 - 0.5 K/uL  ? Basophils Relative 1 %  ? Basophils Absolute 0.1 0.0 - 0.1 K/uL  ?  Immature Granulocytes 1 %  ? Abs Immature Granulocytes 0.14 (H) 0.00 - 0.07 K/uL  ?  Comment: Performed at [facility], [place] [address], [place], [place] [address]  ?Basic metabolic panel     Status: Abnormal  ? Collection Time: 12/16/21  6:05 PM  ?Result Value Ref Range  ? Sodium 139 135 - 145 mmol/L  ? Potassium 4.2 3.5 - 5.1 mmol/L  ? Chloride 107 98 - 111 mmol/L  ? CO2 25 22 - 32 mmol/L  ? Glucose, Bld 124 (H) 70 - 99 mg/dL  ?  Comment: Glucose reference range applies only to samples taken after fasting for at least 8 hours.  ? BUN 32 (H) 8 - 23 mg/dL  ? Creatinine, Ser 1.14 (H) 0.44 - 1.00 mg/dL  ? Calcium 8.8 (L) 8.9 - 10.3 mg/dL  ? GFR, Estimated 47 (L) >60 mL/min  ?  Comment: (NOTE) ?Calculated using the CKD-EPI Creatinine Equation (2021) ?  ? Anion gap 7 5 - 15  ?  Comment: Performed at [facility], [address] [place], [place] [address]  ?Urinalysis, Routine w reflex microscopic Urine, Clean Catch     Status: Abnormal  ? Collection Time: 12/17/21  4:56 AM  ?Result Value Ref Range  ? Color, Urine YELLOW YELLOW  ? APPearance HAZY (A) CLEAR  ? Specific Gravity, Urine 1.025 1.005 - 1.030  ? pH 5.0 5.0 - 8.0  ? Glucose, UA NEGATIVE NEGATIVE mg/dL  ? Hgb urine dipstick NEGATIVE NEGATIVE  ? Bilirubin Urine NEGATIVE NEGATIVE  ? Ketones, ur NEGATIVE NEGATIVE mg/dL  ? Protein, ur NEGATIVE NEGATIVE mg/dL  ? Nitrite NEGATIVE NEGATIVE  ? Leukocytes,Ua NEGATIVE NEGATIVE  ?  Comment: Performed at [facility], [place] [address], [place], [place] [address]  ? ? ?CT Head Wo Contrast ? ?Result Date: 12/16/2021 ?CLINICAL DATA:  Un witnessed fall EXAM: CT HEAD WITHOUT CONTRAST TECHNIQUE: Contiguous axial images were obtained from the base of the skull through the vertex without intravenous contrast. RADIATION DOSE REDUCTION: This exam was performed according to the departmental dose-optimization program which includes automated exposure control, adjustment of the mA  and/or kV according to patient size and/or use of iterative reconstruction technique. COMPARISON:  07/21/2021 FINDINGS: Brain: Scattered hypodensities throughout the periventricular white matter and bilateral basal ganglia are again noted, consistent with stable chronic small vessel ischemic change. No evidence of acute infarct or hemorrhage. Lateral ventricles are unremarkable. 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 Previously this measured approximately 3.2 x 2.4 by 2.4 cm by my measurement. No acute extra-axial fluid collections. Vascular: Stable atherosclerosis. Skull: Minimal right parietal scalp hematoma. No underlying fracture. The remainder of the calvarium is unremarkable. Sinuses/Orbits: No acute finding. Other: None. IMPRESSION: 1. No acute infarct or hemorrhage. 2. Small right parietal scalp hematoma.  No underlying fracture. 3. Stable mass centered in the right cavernous sinus, which may reflect meningioma or cavernous carotid aneurysm. 4. Stable chronic small-vessel ischemic changes throughout the white matter. Electronically Signed   By: Randa Ngo M.D.   On: 12/16/2021 18:53  ? ?CT Cervical Spine Wo Contrast ? ?Result Date: 12/16/2021 ?CLINICAL DATA:  Ataxia, cervical trauma.  Unwitnessed fall. EXAM: CT CERVICAL SPINE WITHOUT CONTRAST TECHNIQUE: Multidetector CT imaging of the cervical spine was performed without intravenous contrast. Multiplanar CT image reconstructions were also generated. RADIATION DOSE REDUCTION: This exam was performed according to the departmental dose-optimization program which includes automated exposure control, adjustment of the mA and/or kV according to patient size and/or use of iterative reconstruction technique. COMPARISON:  None. FINDINGS: Alignment: Stable 3 mm anterolisthesis C5-6, likely degenerative in nature. Stable mild reversal of the normal cervical lordosis at C4-C7,  again likely degenerative in nature. Skull base and vertebrae: Craniocervical alignment is normal. The atlantodental interval is not widened. No acute fracture of the cervical spine. 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 Meningeal mass considered less likely. This is not well characterized on this examination. No canal hematoma. No prevertebral soft tissue swelling. No paravertebral fluid collections. Visualized salivary glands are unremarkable. No cervical adenopathy. Mild atherosclerotic calcification within the carotid bifurcations. Disc levels: Intervertebral disc space narrowing and endplate remodeling is seen at C5-6 in keeping with changes of advanced degenerative disc disease. Milder degenerative changes are noted at C3-4 and C4-5. Prevertebral soft tissues  are not thickened on sagittal reformats. Review of the axial images demonstrates multilevel uncovertebral and facet arthrosis resulting in moderate right neuroforaminal narrowing at C3-4 and bilaterally at C5-6. Mild bilateral neuroforaminal narrowing at C6-7. No significant canal stenosis. Upper chest: Unremarkable Other: None IMPRESSION: No acute fracture or listhesis of the cervical spine. Stable degenerative disc and degenerative joint disease, as outlined above, with grade 1 anterolisthesis C5-6, ankylosis of the vertebral bodies of C6-7, and multilevel neuroforaminal narrowing as outlined above. 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 This would be better assessed with nonemergent CT arteriography if indicated. Electronically Signed   By: Fidela Salisbury M.D.   On: 12/16/2021 18:59  ? ?DG Chest Portable 1 View ? ?Result Date: 12/16/2021 ?CLINICAL DATA:  Right hip  fracture. Medical clearance for surgical intervention. EXAM: PORTABLE CHEST 1 VIEW COMPARISON:  09/17/2021 FINDINGS: The heart size and mediastinal contours are within normal limits. Both lungs are clear

## 2021-12-17 NOTE — Consult Note (Signed)
? ? ?Reason for Consult: Fall with right displaced femoral neck fracture ?Referring Physician: Darrick Meigs ND ? ?Sarah Acevedo is an 88 y.o. female.  ?HPI: 88 year old female memory care patient with daughter who has power of attorney at bedside.  Patient's with inability to ambulate with right femoral neck fracture displaced.  She normally ambulates on her own in the memory care facility. ? ?Past Medical History:  ?Diagnosis Date  ? Anxiety   ? Basal cell carcinoma   ? Bone spur   ? Cancer [facility])   ? Depression   ? Hypertension   ? Hypothyroidism   ? Ruptured disk   ? ? ?Past Surgical History:  ?Procedure Laterality Date  ? CESAREAN SECTION    ? TMJ ARTHROPLASTY    ? TONSILLECTOMY    ? ? ?No family history on file. ? ?Social History:  reports that she has quit smoking. She has never used smokeless tobacco. She reports that she does not drink alcohol and does not use drugs. ? ?Allergies:  ?Allergies  ?Allergen Reactions  ? Contrast Media [Iodinated Contrast Media] Hives  ?  Not listed on MAR  ? Iodine Hives  ?  Per MAR  ? ? ?Medications: I have reviewed the patient's current medications. ? ?Results for orders placed or performed during the hospital encounter of 12/16/21 (from the past 48 hour(s))  ?CBC with Differential     Status: Abnormal  ? Collection Time: 12/16/21  6:05 PM  ?Result Value Ref Range  ? WBC 12.4 (H) 4.0 - 10.5 K/uL  ? RBC 4.86 3.87 - 5.11 MIL/uL  ? Hemoglobin 13.1 12.0 - 15.0 g/dL  ? HCT 41.8 36.0 - 46.0 %  ? MCV 86.0 80.0 - 100.0 fL  ? MCH 27.0 26.0 - 34.0 pg  ? MCHC 31.3 30.0 - 36.0 g/dL  ? RDW 14.4 11.5 - 15.5 %  ? Platelets 300 150 - 400 K/uL  ? nRBC 0.0 0.0 - 0.2 %  ? Neutrophils Relative % 68 %  ? Neutro Abs 8.4 (H) 1.7 - 7.7 K/uL  ? Lymphocytes Relative 20 %  ? Lymphs Abs 2.5 0.7 - 4.0 K/uL  ? Monocytes Relative 8 %  ? Monocytes Absolute 0.9 0.1 - 1.0 K/uL  ? Eosinophils Relative 2 %  ? Eosinophils Absolute 0.3 0.0 - 0.5 K/uL  ? Basophils Relative 1 %  ? Basophils Absolute 0.1 0.0 - 0.1 K/uL  ?  Immature Granulocytes 1 %  ? Abs Immature Granulocytes 0.14 (H) 0.00 - 0.07 K/uL  ?  Comment: Performed at [facility], [place] [address], [place], [place] [address]  ?Basic metabolic panel     Status: Abnormal  ? Collection Time: 12/16/21  6:05 PM  ?Result Value Ref Range  ? Sodium 139 135 - 145 mmol/L  ? Potassium 4.2 3.5 - 5.1 mmol/L  ? Chloride 107 98 - 111 mmol/L  ? CO2 25 22 - 32 mmol/L  ? Glucose, Bld 124 (H) 70 - 99 mg/dL  ?  Comment: Glucose reference range applies only to samples taken after fasting for at least 8 hours.  ? BUN 32 (H) 8 - 23 mg/dL  ? Creatinine, Ser 1.14 (H) 0.44 - 1.00 mg/dL  ? Calcium 8.8 (L) 8.9 - 10.3 mg/dL  ? GFR, Estimated 47 (L) >60 mL/min  ?  Comment: (NOTE) ?Calculated using the CKD-EPI Creatinine Equation (2021) ?  ? Anion gap 7 5 - 15  ?  Comment: Performed at [facility], [address] [place], [place] [address]  ?Urinalysis, Routine w reflex microscopic Urine, Clean Catch     Status: Abnormal  ? Collection Time: 12/17/21  4:56 AM  ?Result Value Ref Range  ? Color, Urine YELLOW YELLOW  ? APPearance HAZY (A) CLEAR  ? Specific Gravity, Urine 1.025 1.005 - 1.030  ? pH 5.0 5.0 - 8.0  ? Glucose, UA NEGATIVE NEGATIVE mg/dL  ? Hgb urine dipstick NEGATIVE NEGATIVE  ? Bilirubin Urine NEGATIVE NEGATIVE  ? Ketones, ur NEGATIVE NEGATIVE mg/dL  ? Protein, ur NEGATIVE NEGATIVE mg/dL  ? Nitrite NEGATIVE NEGATIVE  ? Leukocytes,Ua NEGATIVE NEGATIVE  ?  Comment: Performed at [facility], [place] [address] [place], [place] [address]  ? ? ?CT Head Wo Contrast ? ?Result Date: 12/16/2021 ?CLINICAL DATA:  Un witnessed fall EXAM: CT HEAD WITHOUT CONTRAST TECHNIQUE: Contiguous axial images were obtained from the base of the skull through the vertex without intravenous contrast. RADIATION DOSE REDUCTION: This exam was performed according to the departmental dose-optimization program which includes automated exposure control, adjustment of the mA  and/or kV according to patient size and/or use of iterative reconstruction technique. COMPARISON:  07/21/2021 FINDINGS: Brain: Scattered hypodensities throughout the periventricular white matter and bilateral basal ganglia are again noted, consistent with stable chronic small vessel ischemic change. No evidence of acute infarct or hemorrhage. Lateral ventricles are unremarkable. 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 Previously this measured approximately 3.2 x 2.4 by 2.4 cm by my measurement. No acute extra-axial fluid collections. Vascular: Stable atherosclerosis. Skull: Minimal right parietal scalp hematoma. No underlying fracture. The remainder of the calvarium is unremarkable. Sinuses/Orbits: No acute finding. Other: None. IMPRESSION: 1. No acute infarct or hemorrhage. 2. Small right parietal scalp hematoma.  No underlying fracture. 3. Stable mass centered in the right cavernous sinus, which may reflect meningioma or cavernous carotid aneurysm. 4. Stable chronic small-vessel ischemic changes throughout the white matter. Electronically Signed   By: Randa Ngo M.D.   On: 12/16/2021 18:53  ? ?CT Cervical Spine Wo Contrast ? ?Result Date: 12/16/2021 ?CLINICAL DATA:  Ataxia, cervical trauma.  Unwitnessed fall. EXAM: CT CERVICAL SPINE WITHOUT CONTRAST TECHNIQUE: Multidetector CT imaging of the cervical spine was performed without intravenous contrast. Multiplanar CT image reconstructions were also generated. RADIATION DOSE REDUCTION: This exam was performed according to the departmental dose-optimization program which includes automated exposure control, adjustment of the mA and/or kV according to patient size and/or use of iterative reconstruction technique. COMPARISON:  None. FINDINGS: Alignment: Stable 3 mm anterolisthesis C5-6, likely degenerative in nature. Stable mild reversal of the normal cervical lordosis at C4-C7,  again likely degenerative in nature. Skull base and vertebrae: Craniocervical alignment is normal. The atlantodental interval is not widened. No acute fracture of the cervical spine. 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 Meningeal mass considered less likely. This is not well characterized on this examination. No canal hematoma. No prevertebral soft tissue swelling. No paravertebral fluid collections. Visualized salivary glands are unremarkable. No cervical adenopathy. Mild atherosclerotic calcification within the carotid bifurcations. Disc levels: Intervertebral disc space narrowing and endplate remodeling is seen at C5-6 in keeping with changes of advanced degenerative disc disease. Milder degenerative changes are noted at C3-4 and C4-5. Prevertebral soft tissues  are not thickened on sagittal reformats. Review of the axial images demonstrates multilevel uncovertebral and facet arthrosis resulting in moderate right neuroforaminal narrowing at C3-4 and bilaterally at C5-6. Mild bilateral neuroforaminal narrowing at C6-7. No significant canal stenosis. Upper chest: Unremarkable Other: None IMPRESSION: No acute fracture or listhesis of the cervical spine. Stable degenerative disc and degenerative joint disease, as outlined above, with grade 1 anterolisthesis C5-6, ankylosis of the vertebral bodies of C6-7, and multilevel neuroforaminal narrowing as outlined above. 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 This would be better assessed with nonemergent CT arteriography if indicated. Electronically Signed   By: Fidela Salisbury M.D.   On: 12/16/2021 18:59  ? ?DG Chest Portable 1 View ? ?Result Date: 12/16/2021 ?CLINICAL DATA:  Right hip  fracture. Medical clearance for surgical intervention. EXAM: PORTABLE CHEST 1 VIEW COMPARISON:  09/17/2021 FINDINGS: The heart size and mediastinal contours are within normal limits. Both lungs are clear

## 2021-12-17 NOTE — Anesthesia Postprocedure Evaluation (Signed)
Anesthesia Post Note ? ?Patient: Everlene Other ? ?Procedure(s) Performed: ARTHROPLASTY BIPOLAR HIP (HEMIARTHROPLASTY) (Right: Hip) ? ?  ? ?Patient location during evaluation: PACU ?Anesthesia Type: General ?Level of consciousness: lethargic and responds to stimulation ?Pain management: pain level controlled ?Vital Signs Assessment: post-procedure vital signs reviewed and stable ?Respiratory status: spontaneous breathing, nonlabored ventilation and respiratory function stable ?Cardiovascular status: blood pressure returned to baseline and stable ?Postop Assessment: no apparent nausea or vomiting ?Anesthetic complications: no ?Comments: Pt is lethargic but intermittently opens eyes and responds. Has baseline significant dementia. Moving all 4 extremities. ? ? ?No notable events documented. ? ?Last Vitals:  ?Vitals:  ? 12/17/21 2115 12/17/21 2130  ?BP: (!) 102/53 98/61  ?Pulse: (!) 55 (!) 51  ?Resp: (!) 25 (!) 21  ?Temp:    ?SpO2: 97% 98%  ?  ?Last Pain:  ?Vitals:  ? 12/17/21 1613  ?TempSrc:   ?PainSc: Asleep  ? ? ?  ?  ?  ?  ?  ?  ? ?Lidia Collum ? ? ? ? ?

## 2021-12-17 NOTE — Progress Notes (Signed)
OT Cancellation Note ? ?Patient Details ?Name: Sarah Acevedo ?MRN: [id number] ?DOB: Sep 04, 1933 ? ? ?Cancelled Treatment:     orders are to start 12/18/21 ? ?Sarah Acevedo ?12/17/2021, 1:20 PM ?

## 2021-12-17 NOTE — Anesthesia Procedure Notes (Signed)
Anesthesia Regional Block: Peng block  ? ?Pre-Anesthetic Checklist: , timeout performed,  Correct Patient, Correct Site, Correct Laterality,  Correct Procedure, Correct Position, site marked,  Risks and benefits discussed,  Surgical consent,  Pre-op evaluation,  At surgeon's request and post-op pain management ? ?Laterality: Right ? ?Prep: chloraprep     ?  ?Needles:  ?Injection technique: Single-shot ? ?Needle Type: Echogenic Stimulator Needle   ? ? ?Needle Length: 10cm  ?Needle Gauge: 20  ? ? ? ?Additional Needles: ? ? ?Procedures:,,,, ultrasound used (permanent image in chart),,    ?Narrative:  ?Start time: 12/17/2021 4:02 PM ?End time: 12/17/2021 4:07 PM ?Injection made incrementally with aspirations every 5 mL. ? ?Performed by: Personally  ?Anesthesiologist: Lidia Collum, MD ? ?Additional Notes: ?Standard monitors applied. Skin prepped. Good needle visualization with ultrasound. Injection made in 5cc increments with no resistance to injection. Patient tolerated the procedure well. ? ? ? ? ? ?

## 2021-12-17 NOTE — Discharge Instructions (Addendum)

## 2021-12-17 NOTE — Op Note (Signed)
Preop diagnosis: Right displaced femoral neck fracture. ? ?Postoperative diagnosis same ? ?Procedure: Right hip press-fit bipolar hemiarthroplasty for displaced femoral neck fracture. ? ?Surgeon: Lorin Mercy MD ? ?Anesthesia: Preoperative hip nerve block plus general anesthesia +10 cc 0.25% Marcaine local skin. ? ?EBL: 200 cc ? ?Implants:Implants ? ?HEAD FEM STD 28X+1.5 STRL - [id number] ? ?Inventory Item: HEAD FEM STD 28X+1.5 STRL Serial no.: [id number] Model/Cat no.: [id number]  ?Implant name: HEAD FEM STD 28X+1.5 STRL - [id number] Laterality: Right Area: Hip  ?Manufacturer: Dewitt Hoes Date of Manufacture:    ?Action: Implanted Number Used: 1   ?Device Identifier:  Device Identifier Type:    ? ?SUMMIT BASIC PRESSFIT SZ3 - [id number] ? ?Inventory Item: SUMMIT BASIC PRESSFIT SZ3 Serial no.: [id number] Model/Cat no.: [id number]  ?Implant name: SUMMIT BASIC PRESSFIT SZ3 - [id number] Laterality: Right Area: Hip  ?Manufacturer: Dewitt Hoes Date of Manufacture:    ?Action: Implanted Number Used: 1   ?Device Identifier:  Device Identifier Type:    ? ?BIPOLAR PROS AML 44MM - [id number] ? ?Inventory Item: BIPOLAR PROS AML 44MM Serial no.: [id number] Model/Cat no.: [id number]  ?Implant nameEulis Foster AML 44MM - [id number] Laterality: Right Area: Hip  ?Manufacturer: Dewitt Hoes Date of Manufacture:    ?Action: Implanted Number Used: 1   ?Device Identifier:  Device Identifier Type:    ? ?Procedure: After induction of preoperative block general anesthesia patient placed lateral position axillary roll blue sticky split sheet DuraPrep Ancef prophylaxis IV TXA.  Impervious stockinette usual total hip sheets for posterior approach was made to clear Steri-Drape since patient was allergic to iodine were applied sealing the skin timeout procedure completed.  Posterior approach Charnley retractor placed performed as tagged after gluteus maximus fibers were split in line with the skin incision.  Posterior capsule opened  head was removed with a corkscrew neck cut 1 fingerbreadth 11 mm above the lesser trochanter.  Preparation the canal progressing up to a Summit basic size 3 which gave excellent tight fit.  We trialed off the broach with a +1.5 neck length and a 44 mm bipolar ball which gave excellent suction fit.  The stem was clear sciatic nerve was protected with the fingertip just outside the acetabulum.  Permanent stem was inserted impacted flush with the calcar no reaming was necessary ball was assembled popped on impacted check to make sure was secure neuro protected again hip reduced.  Flexion to 90 internal rotation 80 no subluxation.  Irrigation.  Posterior capsule repaired with #1 Ethibond piriformis back to the gluteus medius tendon #1 Ethibond.  #1 Ethibond in the tensor fascia gluteus maximus.  ~Subcutaneous tissue skin staple closure Marcaine infiltration 10 cc.  Mepilex 4 x 4's ABD tape and knee immobilizer applied patient transferred to care room in stable condition. ? ?

## 2021-12-17 NOTE — Plan of Care (Signed)
  Problem: Nutrition: Goal: Adequate nutrition will be maintained Outcome: Progressing   Problem: Elimination: Goal: Will not experience complications related to bowel motility Outcome: Progressing   Problem: Pain Managment: Goal: General experience of comfort will improve Outcome: Progressing   

## 2021-12-17 NOTE — Transfer of Care (Signed)
Immediate Anesthesia Transfer of Care Note ? ?Patient: Everlene Other ? ?Procedure(s) Performed: ARTHROPLASTY BIPOLAR HIP (HEMIARTHROPLASTY) (Right: Hip) ? ?Patient Location: PACU ? ?Anesthesia Type:General ? ?Level of Consciousness: sedated and patient cooperative ? ?Airway & Oxygen Therapy: Patient Spontanous Breathing and Patient connected to face mask oxygen ? ?Post-op Assessment: Report given to RN, Post -op Vital signs reviewed and stable and Patient moving all extremities X 4 ? ?Post vital signs: stable ? ?Last Vitals:  ?Vitals Value Taken Time  ?BP 149/63 12/17/21 1852  ?Temp    ?Pulse 61 12/17/21 1859  ?Resp 18 12/17/21 1859  ?SpO2 89 % 12/17/21 1859  ?Vitals shown include unvalidated device data. ? ?Last Pain:  ?Vitals:  ? 12/17/21 1613  ?TempSrc:   ?PainSc: Asleep  ?   ? ?Patients Stated Pain Goal: 0 (12/16/21 2241) ? ?Complications: No notable events documented. ?

## 2021-12-17 NOTE — Progress Notes (Signed)
[facility] ? ?PROGRESS NOTE ? ?Sarah Acevedo XLK:[id number] DOB: 1933-07-22 DOA: 12/16/2021 ?PCP: Margretta Sidle, MD ? ? ?Brief HPI:   ?88 year old female with medical history for dementia with behavior disturbance, dysphagia, essential hypertension, hyperlipidemia, CKD stage IIIa, hypothyroidism presented to the ED via EMS after an unwitnessed fall.  Patient has been having hallucinations and her dose of olanzapine was adjusted by her psychiatrist.  Upon presentation in the ED work-up revealed acute displaced externally rotated right subcapital femoral neck fracture.  CT head and CT cervical spine were unremarkable.  ED provider discussed with orthopedic surgery, who recommended surgery today. ? ? ? ?Subjective  ? ?Patient is very somnolent this morning after she received Dilaudid this morning.  Plan for surgery today. ? ? Assessment/Plan:  ? ?Acute displaced right subcapital femoral neck fracture ?-Orthopedic surgery consulted ?-Plan for surgery today ? ?Leukocytosis ?-WBC 12.4 ?-Likely reactive from fracture ?-Chest x-ray unremarkable ? ?Acute hypoxemic respiratory failure ?-Patient requiring 3 L/min of oxygen via nasal cannula ?-Unclear etiology ?-Chest x-ray is unremarkable ?-Wean oxygen as tolerated, if still requiring oxygen by tomorrow will consider CTA chest to rule out pulmonary embolism/underlying lung disease ? ?QTc prolongation ?-QTc interval prolonged on EKG ?-Keep potassium more than 2, magnesium more than 2 ?-Avoid QTc prolonging agents ? ?Dementia with behavioral disturbance ?-Continue home regimen ?-Aricept and trazodone on hold due to QTc elongation ? ?Hypothyroidism ?-Continue Synthroid ? ?Physical disability with recurrent falls ?-PT consulted ? ? ? ? ?Medications ? ?  ? [MAR Hold] enoxaparin (LOVENOX) injection  30 mg Subcutaneous Q24H  ? [MAR Hold] levothyroxine  50 mcg Oral Q0600  ? [MAR Hold] melatonin  3 mg Oral QHS  ? [MAR Hold] OLANZapine  10 mg Oral QHS  ? [MAR Hold] sertraline  100  mg Oral Daily  ? ? ? Data Reviewed:  ? ?CBG: ? ?No results for input(s): GLUCAP in the last 168 hours. ? ?SpO2: 95 % ?O2 Flow Rate (L/min): 3 L/min  ? ? ?Vitals:  ? 12/17/21 0146 12/17/21 0643 12/17/21 0959 12/17/21 1402  ?BP: (!) 154/71 (!) 151/83 (!) 152/70 (!) 128/53  ?Pulse: (!) 55 67 (!) 55 (!) 58  ?Resp: 16 (!) '22 16 16  '$ ?Temp: 97.9 ?F (36.6 ?C) 98 ?F (36.7 ?C) 97.6 ?F (36.4 ?C) (!) 97.5 ?F (36.4 ?C)  ?TempSrc: Oral Oral Oral Oral  ?SpO2: 96% 95% 98% 95%  ?Weight:      ?Height:      ? ? ? ? ?Data Reviewed: ? ?Basic Metabolic Panel: ?Recent Labs  ?Lab 12/16/21 ?3664  ?NA 139  ?K 4.2  ?CL 107  ?CO2 25  ?GLUCOSE 124*  ?BUN 32*  ?CREATININE 1.14*  ?CALCIUM 8.8*  ? ? ?CBC: ?Recent Labs  ?Lab 12/16/21 ?4034  ?WBC 12.4*  ?NEUTROABS 8.4*  ?HGB 13.1  ?HCT 41.8  ?MCV 86.0  ?PLT 300  ? ? ?LFT ?No results for input(s): AST, ALT, ALKPHOS, BILITOT, PROT, ALBUMIN in the last 168 hours. ?  ?Antibiotics: ?Anti-infectives (From admission, onward)  ? ? None  ? ?  ? ? ? ?DVT prophylaxis: Lovenox ? ? ?Code Status: Full code ? ?Family Communication: No family at bedside ? ? ?CONSULTS orthopedics ? ? ?Objective  ? ? ?Physical Examination: ? ? ?General-appears somnolent ?Heart-S1-S2, regular, no murmur auscultated ?Lungs-clear to auscultation bilaterally, no wheezing or crackles auscultated ?Abdomen-soft, nontender, no organomegaly ?Extremities-no edema in the lower extremities ?Neuro-somnolent but arousable, no focal deficit noted ? ?Status is: Inpatient: Hip fracture ? ? ? ?  ? ? ? ? ? ?  Oswald Hillock ?  ?[facility] ?If 7PM-7AM, please contact night-coverage at [email or web address], ?Office  [phone] ? ? ?12/17/2021, 3:46 PM  LOS: 1 day  ? ? ? ? ? ? ? ? ? ? ?  ?

## 2021-12-17 NOTE — Anesthesia Procedure Notes (Signed)
Procedure Name: Intubation ?Date/Time: 12/17/2021 5:29 PM ?Performed by: Cynda Familia, CRNA ?Pre-anesthesia Checklist: Patient identified, Emergency Drugs available, Suction available and Patient being monitored ?Patient Re-evaluated:Patient Re-evaluated prior to induction ?Oxygen Delivery Method: Circle system utilized ?Preoxygenation: Pre-oxygenation with 100% oxygen ?Induction Type: IV induction ?Ventilation: Mask ventilation without difficulty ?Laryngoscope Size: Sabra Heck and 2 ?Grade View: Grade I ?Tube type: Oral ?Number of attempts: 1 ?Airway Equipment and Method: Stylet ?Placement Confirmation: ETT inserted through vocal cords under direct vision, positive ETCO2 and breath sounds checked- equal and bilateral ?Secured at: 21 cm ?Tube secured with: Tape ?Dental Injury: Teeth and Oropharynx as per pre-operative assessment  ?Comments: IV induction [place]-- intubation AM CRNA atraumatic-- mouth as preop -- no teeth-- bilat BS = ? ? ? ? ?

## 2021-12-17 NOTE — Interval H&P Note (Signed)
History and Physical Interval Note: ? ?12/17/2021 ?4:06 PM ? ?Sarah Acevedo  has presented today for surgery, with the diagnosis of femoral neck fracture right.  The various methods of treatment have been discussed with the patient and family. After consideration of risks, benefits and other options for treatment, the patient has consented to  Procedure(s): ?ARTHROPLASTY BIPOLAR HIP (HEMIARTHROPLASTY) (Right) as a surgical intervention.  The patient's history has been reviewed, patient examined, no change in status, stable for surgery.  I have reviewed the patient's chart and labs.  Questions were answered to the patient's satisfaction.   ? ? ?[name] ? ? ?

## 2021-12-17 NOTE — Progress Notes (Signed)
Patient ID: Sarah Acevedo, female   DOB: April 20, 1933, 88 y.o.   MRN: [id number] ?Posted for right hip hemiarthroplasty late afternoon. Discussed with daughter at bedside. She agrees to proceed.  ?

## 2021-12-17 NOTE — Anesthesia Preprocedure Evaluation (Addendum)
Anesthesia Evaluation  ?Patient identified by MRN, date of birth, ID band ?Patient awake ? ? ? ?Reviewed: ?Allergy & Precautions, NPO status , Patient's Chart, lab work & pertinent test results ? ?History of Anesthesia Complications ?Negative for: history of anesthetic complications ? ?Airway ?Mallampati: II ? ?TM Distance: >3 FB ?Neck ROM: Full ? ? ? Dental ? ?(+) Dental Advisory Given ?  ?Pulmonary ?neg pulmonary ROS, former smoker,  ?  ?Pulmonary exam normal ? ? ? ? ? ? ? Cardiovascular ?hypertension, Normal cardiovascular exam ? ? ?  ?Neuro/Psych ?Anxiety Depression Dementia negative neurological ROS ?   ? GI/Hepatic ?negative GI ROS, Neg liver ROS,   ?Endo/Other  ?Hypothyroidism  ? Renal/GU ?CRFRenal disease (JQZ0S)  ?negative genitourinary ?  ?Musculoskeletal ?negative musculoskeletal ROS ?(+)  ? Abdominal ?  ?Peds ? Hematology ?negative hematology ROS ?(+)   ?Anesthesia Other Findings ? ?Femoral neck fracture ?Acute hypoxia on admission 85% on RA, currently on [place] 3L ? Reproductive/Obstetrics ? ?  ? ? ? ? ? ? ? ? ? ? ? ? ? ?  ?  ? ? ? ? ? ? ? ?Anesthesia Physical ?Anesthesia Plan ? ?ASA: 3 and emergent ? ?Anesthesia Plan: General  ? ?Post-op Pain Management: Ofirmev IV (intra-op)* and Regional block*  ? ?Induction: Intravenous ? ?PONV Risk Score and Plan: 3 and Ondansetron, Dexamethasone, Treatment may vary due to age or medical condition and Midazolam ? ?Airway Management Planned: Oral ETT ? ?Additional Equipment: None ? ?Intra-op Plan:  ? ?Post-operative Plan: Extubation in OR ? ?Informed Consent: I have reviewed the patients History and Physical, chart, labs and discussed the procedure including the risks, benefits and alternatives for the proposed anesthesia with the patient or authorized representative who has indicated his/her understanding and acceptance.  ? ? ? ?Consent reviewed with POA ? ?Plan Discussed with:  ? ?Anesthesia Plan Comments: (Reviewed consent by phone  with patient's daughter Milus Mallick. Plan for regional block preop for pain control and GA for surgery. )  ? ? ? ? ? ?Anesthesia Quick Evaluation ? ?

## 2021-12-18 DIAGNOSIS — S72001A Fracture of unspecified part of neck of right femur, initial encounter for closed fracture: Secondary | ICD-10-CM | POA: Diagnosis not present

## 2021-12-18 DIAGNOSIS — J9601 Acute respiratory failure with hypoxia: Secondary | ICD-10-CM | POA: Diagnosis not present

## 2021-12-18 LAB — CBC
HCT: 33 % — ABNORMAL LOW (ref 36.0–46.0)
Hemoglobin: 10 g/dL — ABNORMAL LOW (ref 12.0–15.0)
MCH: 27 pg (ref 26.0–34.0)
MCHC: 30.3 g/dL (ref 30.0–36.0)
MCV: 89.2 fL (ref 80.0–100.0)
Platelets: 206 10*3/uL (ref 150–400)
RBC: 3.7 MIL/uL — ABNORMAL LOW (ref 3.87–5.11)
RDW: 14.2 % (ref 11.5–15.5)
WBC: 8 10*3/uL (ref 4.0–10.5)
nRBC: 0 % (ref 0.0–0.2)

## 2021-12-18 LAB — COMPREHENSIVE METABOLIC PANEL
ALT: 12 U/L (ref 0–44)
AST: 21 U/L (ref 15–41)
Albumin: 3.2 g/dL — ABNORMAL LOW (ref 3.5–5.0)
Alkaline Phosphatase: 73 U/L (ref 38–126)
Anion gap: 7 (ref 5–15)
BUN: 22 mg/dL (ref 8–23)
CO2: 26 mmol/L (ref 22–32)
Calcium: 8.3 mg/dL — ABNORMAL LOW (ref 8.9–10.3)
Chloride: 107 mmol/L (ref 98–111)
Creatinine, Ser: 0.98 mg/dL (ref 0.44–1.00)
GFR, Estimated: 56 mL/min — ABNORMAL LOW (ref >60)
Glucose, Bld: 147 mg/dL — ABNORMAL HIGH (ref 70–99)
Potassium: 4.4 mmol/L (ref 3.5–5.1)
Sodium: 140 mmol/L (ref 135–145)
Total Bilirubin: 0.8 mg/dL (ref 0.3–1.2)
Total Protein: 5.6 g/dL — ABNORMAL LOW (ref 6.5–8.1)

## 2021-12-18 MED ORDER — ENSURE ENLIVE PO LIQD
237.0000 mL | Freq: Two times a day (BID) | ORAL | Status: DC
  Administered 2021-12-18 – 2021-12-19 (×2): 237 mL via ORAL

## 2021-12-18 MED ORDER — ORAL CARE MOUTH RINSE
15.0000 mL | Freq: Two times a day (BID) | OROMUCOSAL | Status: DC
  Administered 2021-12-18 – 2021-12-21 (×6): 15 mL via OROMUCOSAL

## 2021-12-18 NOTE — Progress Notes (Signed)
Patient ID: Sarah Acevedo, female   DOB: 1933-07-16, 88 y.o.   MRN: [id number] ?The patient is awake this morning and her daughter is at the bedside.  She had a right hip hemiarthroplasty yesterday through a posterior approach.  Her dressing over the right hip incision is clean and dry.  She has knee immobilizer in place.  Her vital signs are stable and her H&H is stable.  I spoke with her daughter about the process of mobility and getting therapy involved.  I believe she stays at an assisted living facility that does not have skilled nursing.  She would likely need short-term skilled nursing prior to getting back to that environment where she resides. ?

## 2021-12-18 NOTE — Progress Notes (Signed)
Initial Nutrition Assessment ? ?DOCUMENTATION CODES:  ? ?Not applicable ? ?INTERVENTION:  ?Advance diet to DYS 3, encourage PO intake ?Ensure Enlive po BID, each supplement provides 350 kcal and 20 grams of protein. ? ?NUTRITION DIAGNOSIS:  ? ?Increased nutrient needs related to hip fracture as evidenced by estimated needs. ? ?GOAL:  ? ?Patient will meet greater than or equal to 90% of their needs ? ?MONITOR:  ? ?PO intake, Supplement acceptance, Diet advancement ? ?REASON FOR ASSESSMENT:  ? ?Consult ?Hip fracture protocol ? ?ASSESSMENT:  ? ?88 y.o. female with history of dementia, HTN, HLD, CKD3, and hypothyroidism presented to ED after several recent falls at her Memory Care Unit. Imaging revealed right subcapital femoral neck fracture ? ?4/15 - Op, Right hip press-fit bipolar hemiarthroplasty  ? ?Unable to discuss recent nutrition hx with pt at this time, poor historian due to dementia. Reviewed chart and noted that pt is on full liquids. Lethargic after surgery. ? ?Discussed with RN, pt is much more alert and oriented today. MD ok with advancing to DYS 3 diet. Will also add ensure to augment intake. Weight appears mostly stable. ? ?Nutritionally Relevant Medications: ?Scheduled Meds: ? cholecalciferol  2,000 Units Oral Daily  ? docusate sodium  100 mg Oral BID  ? famotidine  20 mg Oral Daily  ? levothyroxine  50 mcg Oral Q0600  ? ?Continuous Infusions: ? lactated ringers 75 mL/hr at 12/18/21 0604  ? ?Labs Reviewed ? ?NUTRITION - FOCUSED PHYSICAL EXAM: ?Defer to in-person assessment ? ?Diet Order:   ?Diet Order   ? ?       ?  DIET DYS 3 Room service appropriate? Yes; Fluid consistency: Thin  Diet effective now       ?  ? ?  ?  ? ?  ? ? ?EDUCATION NEEDS:  ? ?Not appropriate for education at this time ? ?Skin:  Skin Assessment: Reviewed RN Assessment (surgical incision to the right hip) ? ?Last BM:  unsure ? ?Height:  ? ?Ht Readings from Last 1 Encounters:  ?12/16/21 '5\' 1"'$  (1.549 m)  ? ? ?Weight:  ? ?Wt Readings  from Last 1 Encounters:  ?12/16/21 52.5 kg  ? ? ?Ideal Body Weight:  47.7 kg ? ?BMI:  Body mass index is 21.88 kg/m?. ? ?Estimated Nutritional Needs:  ? ?Kcal:  1400-1600 kcal/d ? ?Protein:  70-80 g/d ? ?Fluid:  >/=1.5L/d ? ? ? ?Ranell Patrick, RD, LDN ?Clinical Dietitian ?RD pager # available in [place]  ?After hours/weekend pager # available in [place] ?

## 2021-12-18 NOTE — Progress Notes (Signed)
Patient initially responding on voice and following simple command with drowsiness post op. Hospitalist paged regarding IV hydration overnight as patient was unsafe to take anything by mouth. Per order 540m LR bolus and 716mhr LR infusion initiated.  ? ?Post bolus, patient started opening her eyes spontaneously and verbalizing needs. Throughout the night patient has been progressively improving with able to express needs but intermittently confused. And able to take PO meds without difficulty. ? ?This morning pt c/o sharp pain in the lower abdomen and stated "It feels big". Upon exam, lower abdomen with distention noted, bladder scan showed 701. Provider paged for in and out cath order. 82583mrained from in and out cath. Abdomen distention resolved post catheterization.  ?

## 2021-12-18 NOTE — Progress Notes (Signed)
[facility] ? ?PROGRESS NOTE ? ?Sarah Acevedo XNT:[id number] DOB: 10/21/1932 DOA: 12/16/2021 ?PCP: Margretta Sidle, MD ? ? ?Brief HPI:   ?88 year old female with medical history for dementia with behavior disturbance, dysphagia, essential hypertension, hyperlipidemia, CKD stage IIIa, hypothyroidism presented to the ED via EMS after an unwitnessed fall.  Patient has been having hallucinations and her dose of olanzapine was adjusted by her psychiatrist.  Upon presentation in the ED work-up revealed acute displaced externally rotated right subcapital femoral neck fracture.  CT head and CT cervical spine were unremarkable.  ED provider discussed with orthopedic surgery, who recommended surgery today. ? ? ? ?Subjective  ? ?Patient seen and examined, no new complaints.  Pain well controlled. ? ? Assessment/Plan:  ? ?Acute displaced right subcapital femoral neck fracture ?-Orthopedic surgery consulted ?-Plan for surgery today ? ?Leukocytosis ?-WBC 12.4 ?-Likely reactive from fracture ?-Chest x-ray unremarkable ? ?Acute hypoxemic respiratory failure ?-Patient requiring 3 L/min of oxygen via nasal cannula ?-Unclear etiology ?-Chest x-ray is unremarkable ?-Wean oxygen as tolerated, if still requiring oxygen by tomorrow will consider CTA chest to rule out pulmonary embolism/underlying lung disease ? ?QTc prolongation ?-QTc interval prolonged on EKG ?-Keep potassium more than 2, magnesium more than 2 ?-Avoid QTc prolonging agents ? ?Dementia with behavioral disturbance ?-Continue home regimen ?-Aricept and trazodone on hold due to QTc elongation ? ?Hypothyroidism ?-Continue Synthroid ? ?Physical disability with recurrent falls ?-PT consulted ? ? ? ? ?Medications ? ?  ? acetaminophen  500 mg Oral Q6H  ? artificial tears   Right Eye TID  ? aspirin EC  325 mg Oral Q breakfast  ? cholecalciferol  2,000 Units Oral Daily  ? docusate sodium  100 mg Oral BID  ? donepezil  23 mg Oral QHS  ? enoxaparin (LOVENOX) injection  30 mg  Subcutaneous Q24H  ? famotidine  20 mg Oral Daily  ? levothyroxine  50 mcg Oral Q0600  ? melatonin  3 mg Oral QHS  ? metoprolol succinate  100 mg Oral Daily  ? OLANZapine  10 mg Oral QHS  ? sertraline  100 mg Oral Daily  ? traZODone  100 mg Oral QHS  ? ? ? Data Reviewed:  ? ?CBG: ? ?Recent Labs  ?Lab 12/17/21 ?2022  ?GLUCAP 165*  ? ? ?SpO2: 98 % ?O2 Flow Rate (L/min): 1 L/min  ? ? ?Vitals:  ? 12/18/21 0607 12/18/21 0913 12/18/21 0924 12/18/21 1010  ?BP: (!) 160/57   (!) 114/53  ?Pulse: (!) 55 69  (!) 55  ?Resp: 18   18  ?Temp: 97.7 ?F (36.5 ?C)   97.6 ?F (36.4 ?C)  ?TempSrc: Oral   Oral  ?SpO2: 99%  96% 98%  ?Weight:      ?Height:      ? ? ? ? ?Data Reviewed: ? ?Basic Metabolic Panel: ?Recent Labs  ?Lab 12/16/21 ?1805 12/18/21 ?9675  ?NA 139 140  ?K 4.2 4.4  ?CL 107 107  ?CO2 25 26  ?GLUCOSE 124* 147*  ?BUN 32* 22  ?CREATININE 1.14* 0.98  ?CALCIUM 8.8* 8.3*  ? ? ?CBC: ?Recent Labs  ?Lab 12/16/21 ?1805 12/18/21 ?9163  ?WBC 12.4* 8.0  ?NEUTROABS 8.4*  --   ?HGB 13.1 10.0*  ?HCT 41.8 33.0*  ?MCV 86.0 89.2  ?PLT 300 206  ? ? ?LFT ?Recent Labs  ?Lab 12/18/21 ?8466  ?AST 21  ?ALT 12  ?ALKPHOS 73  ?BILITOT 0.8  ?PROT 5.6*  ?ALBUMIN 3.2*  ? ?  ?Antibiotics: ?Anti-infectives (From admission, onward)  ? ?  Start     Dose/Rate Route Frequency Ordered Stop  ? 12/17/21 1600  ceFAZolin (ANCEF) IVPB 2g/100 mL premix       ? 2 g ?200 mL/hr over 30 Minutes Intravenous On call to O.R. 12/17/21 1551 12/17/21 1732  ? 12/17/21 1554  ceFAZolin (ANCEF) 2-4 GM/100ML-% IVPB       ?Note to Pharmacy: Hassie Bruce A: cabinet override  ?    12/17/21 1554 12/17/21 1807  ? ?  ? ? ? ?DVT prophylaxis: Lovenox ? ? ?Code Status: Full code ? ?Family Communication: Daughter at bedside ? ? ?CONSULTS orthopedics ? ? ?Objective  ? ? ?Physical Examination: ? ? ?General-appears in no acute distress ?Heart-S1-S2, regular, no murmur auscultated ?Lungs-clear to auscultation bilaterally, no wheezing or crackles auscultated ?Abdomen-soft, nontender, no  organomegaly ?Extremities-no edema in the lower extremities ?Neuro-alert, oriented x3, no focal deficit noted ? ?Status is: Inpatient: Hip fracture ? ? ? ?  ? ? ? ? ? ?Oswald Hillock ?  ?[facility] ?If 7PM-7AM, please contact night-coverage at [email or web address], ?Office  [phone] ? ? ?12/18/2021, 1:44 PM  LOS: 2 days  ? ? ? ? ? ? ? ? ? ? ?  ?

## 2021-12-18 NOTE — Evaluation (Signed)
Occupational Therapy Evaluation ?Patient Details ?Name: Sarah Acevedo ?MRN: [id number] ?DOB: 07/23/1933 ?Today's Date: 12/18/2021 ? ? ?History of Present Illness Pt admitted from memory care s/p fall with R hip fx and now s/p R hemi-arthroplasty by posterior approach.  Pt with hx of dementia  ? ?Clinical Impression ?  ?Patient is a 88 year old female who was previously at rollator level at memory care unit. Currently, patient is mod A +2 for bed mobility and transfers with TD for LB dressing/bathing tasks with posterior hip precautions.patient was noted to have increased pain, decreased functional activity tolerance, decreased endurance, decreased safety awareness, decreased knowledge of posterior hip precautions, and  decreased strength impacting participation in [place]. Patient would continue to benefit from skilled OT services at this time while admitted and after d/c to address noted deficits in order to improve overall safety and independence in ADLs.  ? ?   ? ?Recommendations for follow up therapy are one component of a multi-disciplinary discharge planning process, led by the attending physician.  Recommendations may be updated based on patient status, additional functional criteria and insurance authorization.  ? ?Follow Up Recommendations ? Skilled nursing-short term rehab (<3 hours/day)  ?  ?Assistance Recommended at Discharge Frequent or constant Supervision/Assistance  ?Patient can return home with the following Two people to help with walking and/or transfers;Two people to help with bathing/dressing/bathroom;Direct supervision/assist for medications management;Help with stairs or ramp for entrance;Assist for transportation;Direct supervision/assist for financial management;Assistance with cooking/housework ? ?  ?Functional Status Assessment ? Patient has had a recent decline in their functional status and demonstrates the ability to make significant improvements in function in a reasonable and predictable  amount of time.  ?Equipment Recommendations ? None recommended by OT  ?  ?Recommendations for Other Services   ? ? ?  ?Precautions / Restrictions Precautions ?Precautions: Fall;Posterior Hip ?Precaution Booklet Issued: Yes (comment) ?Precaution Comments: THP reviewed with pt and dtr ?Required Braces or Orthoses: Knee Immobilizer - Right ?Knee Immobilizer - Right: Other (comment) (to reduce breaking posterior hip precautions) ?Restrictions ?Weight Bearing Restrictions: No ?RLE Weight Bearing: Weight bearing as tolerated  ? ?  ? ?Mobility Bed Mobility ?Overal bed mobility: Needs Assistance ?Bed Mobility: Supine to Sit ?  ?  ?Supine to sit: Mod assist, +2 for physical assistance, +2 for safety/equipment, HOB elevated ?  ?  ?General bed mobility comments: Increased time with cues for sequence and use of L LE to self assist.  Physical assist to manage R LE, control trunk and complete rotation to EOB sitting using bed pad ?  ? ?Transfers ?  ?  ?  ?  ?  ?  ?  ?  ?  ?  ?  ? ?  ?Balance Overall balance assessment: Needs assistance ?Sitting-balance support: No upper extremity supported, Feet supported ?Sitting balance-Leahy Scale: Fair ?  ?  ?Standing balance support: Bilateral upper extremity supported ?Standing balance-Leahy Scale: Poor ?  ?  ?  ?  ?  ?  ?  ?  ?  ?  ?  ?  ?   ? ?ADL either performed or assessed with clinical judgement  ? ?ADL Overall ADL's : Needs assistance/impaired ?Eating/Feeding: Set up;Sitting ?  ?Grooming: Dance movement psychotherapist;Wash/dry hands;Sitting;Minimal assistance ?  ?Upper Body Bathing: Minimal assistance;Sitting ?  ?Lower Body Bathing: Total assistance;Bed level ?Lower Body Bathing Details (indicate cue type and reason): posterior hip precautions ?Upper Body Dressing : Minimal assistance;Sitting ?  ?Lower Body Dressing: Maximal assistance;Bed level ?Lower Body Dressing Details (indicate cue  type and reason): posterior hip precautions ?Toilet Transfer: +2 for physical assistance;+2 for  safety/equipment;Moderate assistance ?Toilet Transfer Details (indicate cue type and reason): with patient noted to take large steps with RLE and needing consistent cues for sequencing. patient was easily distracted with multimodal cues for each step provided. transfer from edge of bed to recliner with recliner pulled over to patient ?Toileting- Clothing Manipulation and Hygiene: Total assistance;Sit to/from stand ?  ?  ?  ?Functional mobility during ADLs: +2 for physical assistance;+2 for safety/equipment;Moderate assistance ?   ? ? ? ?Vision Patient Visual Report: No change from baseline ?Additional Comments: possible esotropia of R eye  ?   ?Perception   ?  ?Praxis   ?  ? ?Pertinent Vitals/Pain Pain Assessment ?Pain Assessment: Faces ?Faces Pain Scale: Hurts little more ?Pain Location: Right hip with activity ?Pain Descriptors / Indicators: Aching, Sore ?Pain Intervention(s): Limited activity within patient's tolerance, Monitored during session, Premedicated before session  ? ? ? ?Hand Dominance Right ?  ?Extremity/Trunk Assessment Upper Extremity Assessment ?Upper Extremity Assessment: Overall WFL for tasks assessed ?  ?Lower Extremity Assessment ?Lower Extremity Assessment: Defer to PT evaluation ?RLE Deficits / Details: AAROM WFL following THP ?  ?Cervical / Trunk Assessment ?Cervical / Trunk Assessment: Normal ?  ?Communication Communication ?Communication: No difficulties ?  ?Cognition Arousal/Alertness: Awake/alert ?Behavior During Therapy: [facility] for tasks assessed/performed ?Overall Cognitive Status: Within Functional Limits for tasks assessed ?  ?  ?  ?  ?  ?  ?  ?  ?  ?  ?  ?  ?  ?  ?  ?  ?General Comments: patient noted to have memory and cog deficits at baseline with patient living in memory care unit PLOF ?  ?  ?General Comments    ? ?  ?Exercises   ?  ?Shoulder Instructions    ? ? ?Home Living Family/patient expects to be discharged to:: Skilled nursing facility ?  ?  ?  ?  ?  ?  ?  ?  ?  ?  ?  ?  ?  ?   ?  ?  ?Additional Comments: Memory care resident ?  ? ?  ?Prior Functioning/Environment Prior Level of Function : Independent/Modified Independent ?  ?  ?  ?  ?  ?  ?Mobility Comments: used rollator ?ADLs Comments: patient had caregiver support as needed in memory care unit to sequence. ?  ? ?  ?  ?OT Problem List: Decreased activity tolerance;Impaired balance (sitting and/or standing);Decreased safety awareness;Decreased knowledge of use of DME or AE;Decreased knowledge of precautions;Cardiopulmonary status limiting activity;Pain ?  ?   ?OT Treatment/Interventions: Self-care/ADL training;Therapeutic exercise;Neuromuscular education;Energy conservation;DME and/or AE instruction;Therapeutic activities;Balance training;Patient/family education  ?  ?OT Goals(Current goals can be found in the care plan section) Acute Rehab OT Goals ?Patient Stated Goal: to get better ?OT Goal Formulation: With patient/family ?Time For Goal Achievement: 01/01/22 ?Potential to Achieve Goals: Good  ?OT Frequency: Min 2X/week ?  ? ?Co-evaluation PT/OT/SLP Co-Evaluation/Treatment: Yes ?Reason for Co-Treatment: For patient/therapist safety;To address functional/ADL transfers ?PT goals addressed during session: Mobility/safety with mobility ?OT goals addressed during session: ADL's and self-care ?  ? ?  ?AM-PAC OT "6 Clicks" Daily Activity     ?Outcome Measure Help from another person eating meals?: A Little ?Help from another person taking care of personal grooming?: A Little ?Help from another person toileting, which includes using toliet, bedpan, or urinal?: Total ?Help from another person bathing (including washing, rinsing, drying)?: A Lot ?Help  from another person to put on and taking off regular upper body clothing?: A Little ?Help from another person to put on and taking off regular lower body clothing?: A Lot ?6 Click Score: 14 ?  ?End of Session Equipment Utilized During Treatment: Gait belt;Rolling walker (2 wheels) ?Nurse  Communication: Mobility status ? ?Activity Tolerance: Patient tolerated treatment well ?Patient left: in chair;with call bell/phone within reach;with chair alarm set ? ?OT Visit Diagnosis: Unsteadiness on feet (R26.81);Muscle w

## 2021-12-18 NOTE — Evaluation (Signed)
Physical Therapy Evaluation ?Patient Details ?Name: Sarah Acevedo ?MRN: [id number] ?DOB: [birth year] ?Today's Date: 12/18/2021 ? ?History of Present Illness ? Pt admitted from memory care s/p fall with R hip fx and now s/p R hemi-arthroplasty by posterior approach.  Pt with hx of dementia  ?Clinical Impression ? Pt admitted as above and presenting with functional mobility limitations 2* decreased R LE strength/ROM, post op pain, posterior THP and dementia related cognitive deficits.  Pt very cooperative but requiring significant assist of 2 for performance of all basic mobility tasks and would benefit from follow up rehab at SNF level to maximize IND and safety. ?   ? ?Recommendations for follow up therapy are one component of a multi-disciplinary discharge planning process, led by the attending physician.  Recommendations may be updated based on patient status, additional functional criteria and insurance authorization. ? ?Follow Up Recommendations Skilled nursing-short term rehab (<3 hours/day) ? ?  ?Assistance Recommended at Discharge Frequent or constant Supervision/Assistance  ?Patient can return home with the following ? A lot of help with walking and/or transfers;A little help with bathing/dressing/bathroom;Assistance with cooking/housework;Assist for transportation;Help with stairs or ramp for entrance ? ?  ?Equipment Recommendations None recommended by PT  ?Recommendations for Other Services ?    ?  ?Functional Status Assessment Patient has had a recent decline in their functional status and demonstrates the ability to make significant improvements in function in a reasonable and predictable amount of time.  ? ?  ?Precautions / Restrictions Precautions ?Precautions: Fall;Posterior Hip ?Precaution Booklet Issued: Yes (comment) ?Precaution Comments: THP reviewed with pt and dtr ?Required Braces or Orthoses: Knee Immobilizer - Right ?Knee Immobilizer - Right: Other (comment) (to minimize risk of breaking posterior  THP) ?Restrictions ?Weight Bearing Restrictions: No ?RLE Weight Bearing: Weight bearing as tolerated  ? ?  ? ?Mobility ? Bed Mobility ?Overal bed mobility: Needs Assistance ?Bed Mobility: Supine to Sit ?  ?  ?Supine to sit: Mod assist, +2 for physical assistance, +2 for safety/equipment, HOB elevated ?  ?  ?General bed mobility comments: Increased time with cues for sequence and use of L LE to self assist.  Physical assist to manage R LE, control trunk and complete rotation to EOB sitting using bed pad ?  ? ?Transfers ?Overall transfer level: Needs assistance ?Equipment used: Rolling walker (2 wheels) ?Transfers: Sit to/from Stand ?Sit to Stand: Min assist, Mod assist, +2 physical assistance, +2 safety/equipment, From elevated surface ?  ?  ?  ?  ?  ?General transfer comment: cues for LE management, use of UEs to self assist and adherence to THP;  Physical assist to bring wt up and forward and to balance in standing with RW ?  ? ?Ambulation/Gait ?Ambulation/Gait assistance: Min assist, Mod assist, +2 safety/equipment ?Gait Distance (Feet): 4 Feet ?Assistive device: Rolling walker (2 wheels) ?Gait Pattern/deviations: Step-to pattern, Decreased step length - right, Decreased step length - left, Step-through pattern, Shuffle, Knees buckling, Antalgic, Trunk flexed ?Gait velocity: decr ?  ?  ?General Gait Details: cues for sequence, posture, to decrease step length on L and position from RW;  Physical assist for balance/support and RW management ? ?Stairs ?  ?  ?  ?  ?  ? ?Wheelchair Mobility ?  ? ?Modified Rankin (Stroke Patients Only) ?  ? ?  ? ?Balance Overall balance assessment: Needs assistance ?Sitting-balance support: No upper extremity supported, Feet supported ?Sitting balance-Leahy Scale: Fair ?  ?  ?Standing balance support: Bilateral upper extremity supported ?Standing balance-Leahy Scale: Poor ?  ?  ?  ?  ?  ?  ?  ?  ?  ?  ?  ?  ?   ? ? ? ?  Pertinent Vitals/Pain Pain Assessment ?Pain Assessment: Faces ?Faces  Pain Scale: Hurts little more ?Pain Location: Right hip with activity ?Pain Descriptors / Indicators: Aching, Sore ?Pain Intervention(s): Limited activity within patient's tolerance, Monitored during session, Premedicated before session  ? ? ?Home Living Family/patient expects to be discharged to:: Skilled nursing facility ?  ?  ?  ?  ?  ?  ?  ?  ?  ?Additional Comments: Memory care resident  ?  ?Prior Function Prior Level of Function : Independent/Modified Independent ?  ?  ?  ?  ?  ?  ?Mobility Comments: used rollator ?  ?  ? ? ?Hand Dominance  ? Dominant Hand: Right ? ?  ?Extremity/Trunk Assessment  ? Upper Extremity Assessment ?Upper Extremity Assessment: Defer to OT evaluation ?  ? ?Lower Extremity Assessment ?Lower Extremity Assessment: RLE deficits/detail ?RLE Deficits / Details: AAROM WFL following THP ?  ? ?   ?Communication  ? Communication: No difficulties  ?Cognition Arousal/Alertness: Awake/alert ?Behavior During Therapy: [facility] for tasks assessed/performed ?Overall Cognitive Status: Within Functional Limits for tasks assessed ?  ?  ?  ?  ?  ?  ?  ?  ?  ?  ?  ?  ?  ?  ?  ?  ?  ?  ?  ? ?  ?General Comments   ? ?  ?Exercises Total Joint Exercises ?Ankle Circles/Pumps: AROM, Both, 15 reps, Supine  ? ?Assessment/Plan  ?  ?PT Assessment Patient needs continued PT services  ?PT Problem List Decreased strength;Decreased range of motion;Decreased activity tolerance;Decreased balance;Decreased mobility;Decreased knowledge of use of DME;Pain ? ?   ?  ?PT Treatment Interventions DME instruction;Gait training;Functional mobility training;Therapeutic activities;Therapeutic exercise;Balance training;Patient/family education   ? ?PT Goals (Current goals can be found in the Care Plan section)  ?Acute Rehab PT Goals ?Patient Stated Goal: OOB ?PT Goal Formulation: With patient/family ?Time For Goal Achievement: 01/01/22 ?Potential to Achieve Goals: Good ? ?  ?Frequency Min 3X/week ?  ? ? ?Co-evaluation PT/OT/SLP  Co-Evaluation/Treatment: Yes ?Reason for Co-Treatment: For patient/therapist safety;To address functional/ADL transfers ?PT goals addressed during session: Mobility/safety with mobility ?OT goals addressed during session: ADL's and self-care ?  ? ? ?  ?AM-PAC PT "6 Clicks" Mobility  ?Outcome Measure Help needed turning from your back to your side while in a flat bed without using bedrails?: A Lot ?Help needed moving from lying on your back to sitting on the side of a flat bed without using bedrails?: A Lot ?Help needed moving to and from a bed to a chair (including a wheelchair)?: A Lot ?Help needed standing up from a chair using your arms (e.g., wheelchair or bedside chair)?: A Lot ?Help needed to walk in hospital room?: Total ?Help needed climbing 3-5 steps with a railing? : Total ?6 Click Score: 10 ? ?  ?End of Session Equipment Utilized During Treatment: Gait belt ?Activity Tolerance: Patient tolerated treatment well ?Patient left: in chair;with call bell/phone within reach;with chair alarm set;with family/visitor present ?Nurse Communication: Mobility status ?PT Visit Diagnosis: Muscle weakness (generalized) (M62.81);Difficulty in walking, not elsewhere classified (R26.2);History of falling (Z91.81) ?  ? ?Time: 1751-0258 ?PT Time Calculation (min) (ACUTE ONLY): 32 min ? ? ?Charges:   PT Evaluation ?$PT Eval Low Complexity: 1 Low ?  ?  ?   ? ? ?Debe Coder PT ?Acute Rehabilitation Services ?Pager [phone] ?Office [phone] ? ? ?Juston Goheen ?12/18/2021, 3:59 PM ? ?

## 2021-12-19 ENCOUNTER — Encounter (HOSPITAL_COMMUNITY): Payer: Self-pay | Admitting: Orthopaedic Surgery

## 2021-12-19 DIAGNOSIS — J9601 Acute respiratory failure with hypoxia: Secondary | ICD-10-CM | POA: Diagnosis not present

## 2021-12-19 DIAGNOSIS — S72001A Fracture of unspecified part of neck of right femur, initial encounter for closed fracture: Secondary | ICD-10-CM | POA: Diagnosis not present

## 2021-12-19 LAB — CBC
HCT: 27.1 % — ABNORMAL LOW (ref 36.0–46.0)
HCT: 28.8 % — ABNORMAL LOW (ref 36.0–46.0)
Hemoglobin: 8.3 g/dL — ABNORMAL LOW (ref 12.0–15.0)
Hemoglobin: 8.8 g/dL — ABNORMAL LOW (ref 12.0–15.0)
MCH: 27.3 pg (ref 26.0–34.0)
MCH: 27.2 pg (ref 26.0–34.0)
MCHC: 30.6 g/dL (ref 30.0–36.0)
MCHC: 30.6 g/dL (ref 30.0–36.0)
MCV: 89.1 fL (ref 80.0–100.0)
MCV: 89.2 fL (ref 80.0–100.0)
Platelets: 180 10*3/uL (ref 150–400)
Platelets: 184 10*3/uL (ref 150–400)
RBC: 3.04 MIL/uL — ABNORMAL LOW (ref 3.87–5.11)
RBC: 3.23 MIL/uL — ABNORMAL LOW (ref 3.87–5.11)
RDW: 14.3 % (ref 11.5–15.5)
RDW: 14.3 % (ref 11.5–15.5)
WBC: 7.9 10*3/uL (ref 4.0–10.5)
WBC: 8 10*3/uL (ref 4.0–10.5)
nRBC: 0 % (ref 0.0–0.2)
nRBC: 0 % (ref 0.0–0.2)

## 2021-12-19 LAB — LACTIC ACID, PLASMA
Lactic Acid, Venous: 1.5 mmol/L (ref 0.5–1.9)
Lactic Acid, Venous: 1.9 mmol/L (ref 0.5–1.9)

## 2021-12-19 LAB — BASIC METABOLIC PANEL
Anion gap: 8 (ref 5–15)
BUN: 25 mg/dL — ABNORMAL HIGH (ref 8–23)
CO2: 22 mmol/L (ref 22–32)
Calcium: 8 mg/dL — ABNORMAL LOW (ref 8.9–10.3)
Chloride: 109 mmol/L (ref 98–111)
Creatinine, Ser: 1.02 mg/dL — ABNORMAL HIGH (ref 0.44–1.00)
GFR, Estimated: 54 mL/min — ABNORMAL LOW (ref >60)
Glucose, Bld: 103 mg/dL — ABNORMAL HIGH (ref 70–99)
Potassium: 4.3 mmol/L (ref 3.5–5.1)
Sodium: 139 mmol/L (ref 135–145)

## 2021-12-19 MED ORDER — SODIUM CHLORIDE 0.9 % IV BOLUS
500.0000 mL | Freq: Once | INTRAVENOUS | Status: AC
  Administered 2021-12-19: 500 mL via INTRAVENOUS

## 2021-12-19 MED ORDER — METOPROLOL SUCCINATE ER 50 MG PO TB24
50.0000 mg | ORAL_TABLET | Freq: Every day | ORAL | Status: DC
  Administered 2021-12-20 – 2021-12-21 (×2): 50 mg via ORAL
  Filled 2021-12-19 (×2): qty 1

## 2021-12-19 MED ORDER — OLANZAPINE 5 MG PO TABS
15.0000 mg | ORAL_TABLET | Freq: Every day | ORAL | Status: DC
  Administered 2021-12-19 – 2021-12-20 (×2): 15 mg via ORAL
  Filled 2021-12-19 (×2): qty 1

## 2021-12-19 MED ORDER — PANTOPRAZOLE SODIUM 40 MG PO TBEC: 40.0000 mg | DELAYED_RELEASE_TABLET | Freq: Every day | ORAL | Status: DC

## 2021-12-19 MED ORDER — PANTOPRAZOLE SODIUM 40 MG PO TBEC
40.0000 mg | DELAYED_RELEASE_TABLET | Freq: Every day | ORAL | Status: DC
  Administered 2021-12-19 – 2021-12-20 (×2): 40 mg via ORAL
  Filled 2021-12-19 (×2): qty 1

## 2021-12-19 NOTE — [place]2 (Signed)
?[place] MEDICAID FL2 LEVEL OF CARE SCREENING TOOL  ?  ? ?IDENTIFICATION  ?Patient Name: ?Sarah Acevedo Birthdate: [birth year] Sex: female Admission Date (Current Location): ?12/16/2021  ?[place] and [place] Number: ? [place] ?  Facility and Address:  ?[facility],  [place], [place] ?     Provider Number: ?[id number]  ?Attending Physician Name and Address:  ?Oswald Hillock, MD ? Relative Name and Phone Number:  ?Milus Mallick (daughter) [facility]: [phone] ?   ?Current Level of Care: ?Hospital Recommended Level of Care: ?[place] Prior Approval Number: ?  ? ?Date Approved/Denied: ?  PASRR Number: ?Pending ? ?Discharge Plan: ?SNF ?  ? ?Current Diagnoses: ?Patient Active Problem List  ? Diagnosis Date Noted  ? Closed right hip fracture ([place]) 12/16/2021  ? UTI (urinary tract infection) 03/23/2019  ? AKI (acute kidney injury) ([place]) 03/22/2019  ? Hypothyroidism 03/22/2019  ? Failure to thrive in adult 03/22/2019  ? Lipid disorder 03/22/2019  ? Essential hypertension 03/22/2019  ? Depression 03/22/2019  ? Brief psychotic disorder ([place])   ? 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 ? ?Orientation RESPIRATION BLADDER Height & Weight   ?  ?Self, Place ? O2 (1/Lmin PRN) Incontinent Weight: 115 lb 12.8 oz (52.5 kg) ?Height:  '5\' 1"'$  (154.9 cm)  ?BEHAVIORAL SYMPTOMS/MOOD NEUROLOGICAL BOWEL NUTRITION STATUS  ?   (N/A) Continent Diet (Dysphagia 3 diet)  ?AMBULATORY STATUS COMMUNICATION OF NEEDS Skin   ?Extensive Assist Verbally Skin abrasions, Other (Comment) (Ecchymosis: right head, bilateral arms; Abrasion: right head) ?  ?  ?  ?    ?     ?     ? ? ?Personal Care Assistance Level of Assistance  ?Bathing, Feeding, Dressing Bathing Assistance: Maximum assistance (Patient requires max assist with lower body.) ?Feeding assistance: Limited assistance ?Dressing Assistance: Maximum assistance (Patient requires max assist with lower body.) ?   ? ?Functional Limitations  Info  ?Sight, Hearing, Speech Sight Info: Impaired ?Hearing Info: Adequate ?Speech Info: Adequate  ? ? ?SPECIAL CARE FACTORS FREQUENCY  ?PT (By licensed PT), OT (By licensed OT)   ?  ?PT Frequency: 5x's/week ?OT Frequency: 5x's/week ?  ?  ?  ?   ? ? ?Contractures Contractures Info: Not present  ? ? ?Additional Factors Info  ?Code Status, Allergies, Psychotropic Code Status Info: Full ?Allergies Info: Contrast Media (Iodinated Contrast Media), Iodine ?Psychotropic Info: Zyprexa, Zoloft, Desyrel ?  ?  ?   ? ?Current Medications (12/19/2021):  This is the current hospital active medication list ?Current Facility-Administered Medications  ?Medication Dose Route Frequency Provider Last Rate Last Admin  ? acetaminophen (TYLENOL) tablet 325-650 mg  325-650 mg Oral Q6H PRN [name], MD      ? albuterol (PROVENTIL) (2.5 MG/3ML) 0.083% nebulizer solution 2.5 mg  2.5 mg Nebulization Q4H PRN [name], MD      ? alum & mag hydroxide-simeth (MAALOX/MYLANTA) 200-200-20 MG/5ML suspension 30 mL  30 mL Oral Q6H PRN [name], MD      ? artificial tears (LACRILUBE) ophthalmic ointment   Right Eye TID Oswald Hillock, MD   1 application. at 12/18/21 2135  ? aspirin EC tablet 325 mg  325 mg Oral Q breakfast [name], MD   325 mg at 12/19/21 0827  ? bisacodyl (DULCOLAX) suppository 10 mg  10 mg Rectal Daily PRN [name], MD      ? cholecalciferol (VITAMIN D3) tablet 2,000 Units  2,000  Units Oral Daily [name], MD   2,000 Units at 12/18/21 0913  ? docusate sodium (COLACE) capsule 100 mg  100 mg Oral BID [name], MD   100 mg at 12/18/21 2132  ? donepezil (ARICEPT) tablet 23 mg  23 mg Oral QHS [name], MD   23 mg at 12/18/21 2132  ? enoxaparin (LOVENOX) injection 30 mg  30 mg Subcutaneous Q24H [name], MD   30 mg at 12/18/21 0913  ? famotidine (PEPCID) tablet 20 mg  20 mg Oral Daily [name], MD   20 mg at 12/18/21 0913  ? feeding supplement (ENSURE ENLIVE / ENSURE PLUS) liquid 237 mL  237  mL Oral BID BM Oswald Hillock, MD   237 mL at 12/18/21 1725  ? pseudoephedrine (SUDAFED) 12 hr tablet 120 mg  120 mg Oral Q12H PRN Oswald Hillock, MD      ? And  ? guaiFENesin (MUCINEX) 12 hr tablet 1,200 mg  1,200 mg Oral BID PRN Oswald Hillock, MD      ? guaiFENesin (ROBITUSSIN) 100 MG/5ML liquid 100 mg  100 mg Oral Q4H PRN [name], MD      ? HYDROcodone-acetaminophen (NORCO) 7.5-325 MG per tablet 1-2 tablet  1-2 tablet Oral Q4H PRN [name], MD      ? HYDROcodone-acetaminophen (NORCO/VICODIN) 5-325 MG per tablet 1-2 tablet  1-2 tablet Oral Q4H PRN [name], MD   1 tablet at 12/18/21 2132  ? levothyroxine (SYNTHROID) tablet 50 mcg  50 mcg Oral Q0600 [name], MD   50 mcg at 12/19/21 0503  ? MEDLINE mouth rinse  15 mL Mouth Rinse BID Oswald Hillock, MD   15 mL at 12/18/21 2135  ? melatonin tablet 3 mg  3 mg Oral QHS [name], MD   3 mg at 12/18/21 2132  ? menthol-cetylpyridinium (CEPACOL) lozenge 3 mg  1 lozenge Oral PRN [name], MD      ? Or  ? phenol (CHLORASEPTIC) mouth spray 1 spray  1 spray Mouth/Throat PRN [name], MD      ? metoCLOPramide (REGLAN) tablet 5-10 mg  5-10 mg Oral Q8H PRN [name], MD      ? Or  ? metoCLOPramide (REGLAN) injection 5-10 mg  5-10 mg Intravenous Q8H PRN [name], MD      ? metoprolol succinate (TOPROL-XL) 24 hr tablet 100 mg  100 mg Oral Daily [name], MD   100 mg at 12/18/21 0913  ? morphine (PF) 2 MG/ML injection 0.5-1 mg  0.5-1 mg Intravenous Q2H PRN [name], MD   1 mg at 12/18/21 1142  ? OLANZapine (ZYPREXA) tablet 10 mg  10 mg Oral QHS [name], MD   10 mg at 12/18/21 2132  ? ondansetron (ZOFRAN) tablet 4 mg  4 mg Oral Q6H PRN [name], MD      ? Or  ? ondansetron [facility]) injection 4 mg  4 mg Intravenous Q6H PRN [name], MD      ? prochlorperazine (COMPAZINE) injection 10 mg  10 mg Intravenous Q6H PRN [name], MD   10 mg at 12/17/21 0656  ? sertraline (ZOLOFT) tablet 100 mg  100 mg Oral Daily [name], MD   100 mg at 12/18/21 0913  ? traZODone (DESYREL) tablet 100 mg  100 mg Oral QHS [name], MD  100 mg at 12/18/21 2132  ? ? ? ?Discharge Medications: ?Please see discharge summary for a list of discharge medications. ? ?Relevant Imaging Results: ? ?Relevant Lab Results: ? ? ?Additional Information ?SSN: [id number] ? ?Sherie Don, LCSW ? ? ? ? ?

## 2021-12-19 NOTE — TOC Initial Note (Addendum)
Transition of Care (TOC) - Initial/Assessment Note  ? ?Patient Details  ?Name: Sarah Acevedo ?MRN: [id number] ?Date of Birth: Apr 05, 1933 ? ?Transition of Care (TOC) CM/SW Contact:    ?Sherie Don, LCSW ?Phone Number: ?12/19/2021, 10:02 AM ? ?Clinical Narrative: PT and OT evaluations recommended SNF. Patient's daughter is agreeable to rehab and requested [facility] as patient has been there before. Patient is vaccinated x1 for COVID. ? ?FL2 done; PASRR pending. Clinicals uploaded to [facility] for review. Initial referral faxed out. TOC awaiting bed offers and level II PASRR. ? ?Addendum: CSW received PASRR: [id number] E. ? ?Expected Discharge Plan: [place] ?Barriers to Discharge: SNF Pending bed offer, Insurance Authorization ? ?Patient Goals and CMS Choice ?Patient states their goals for this hospitalization and ongoing recovery are:: Go to short-term rehab ?CMS Medicare.gov Compare Post Acute Care list provided to:: Patient Represenative (must comment) ?Choice offered to / list presented to : Adult Children ? ?Expected Discharge Plan and Services ?Expected Discharge Plan: [place] ?In-house Referral: Clinical Social Work ?Post Acute Care Choice: [place] ?Living arrangements for the past 2 months: [place]             ?DME Arranged: N/A ?DME Agency: NA ? ?Prior Living Arrangements/Services ?Living arrangements for the past 2 months: [place] ?Patient language and need for interpreter reviewed:: Yes ?Do you feel safe going back to the place where you live?: Yes      ?Need for Family Participation in Patient Care: Yes (Comment) ?Care giver support system in place?: Yes (comment) ?Criminal Activity/Legal Involvement Pertinent to Current Situation/Hospitalization: No - Comment as needed ? ?Activities of Daily Living ?Home Assistive Devices/Equipment: Gilford Rile (specify type), Dentures (specify type) (full set dentures) ?ADL Screening (condition at time of  admission) ?Patient's cognitive ability adequate to safely complete daily activities?: No ?Is the patient deaf or have difficulty hearing?: No ?Does the patient have difficulty seeing, even when wearing glasses/contacts?: Yes ?Does the patient have difficulty concentrating, remembering, or making decisions?: Yes ?Patient able to express need for assistance with ADLs?: Yes ?Does the patient have difficulty dressing or bathing?: Yes ?Independently performs ADLs?: No ?Communication: Independent ?Dressing (OT): Independent ?Grooming: Independent ?Feeding: Independent ?Bathing: Needs assistance ?Is this a change from baseline?: Pre-admission baseline ?Toileting: Needs assistance ?Is this a change from baseline?: Change from baseline, expected to last >3days ?In/Out Bed: Needs assistance ?Is this a change from baseline?: Change from baseline, expected to last >3 days ?Walks in Home: Dependent ?Is this a change from baseline?: Change from baseline, expected to last >3 days ?Does the patient have difficulty walking or climbing stairs?: Yes ?Weakness of Legs: Right ?Weakness of Arms/Hands: None ? ?Permission Sought/Granted ?Permission sought to share information with : Customer service manager ?Permission granted to share information with : Yes, Verbal Permission Granted ?Permission granted to share info w AGENCY: SNF ? ?Emotional Assessment ?Attitude/Demeanor/Rapport: Unable to Assess ?Affect (typically observed): Unable to Assess ?Orientation: : Oriented to Self, Oriented to Place ?Alcohol / Substance Use: Not Applicable ? ?Admission diagnosis:  Closed right hip fracture ([place]) [S72.001A] ?Patient Active Problem List  ? Diagnosis Date Noted  ? Closed right hip fracture ([place]) 12/16/2021  ? UTI (urinary tract infection) 03/23/2019  ? AKI (acute kidney injury) ([place]) 03/22/2019  ? Hypothyroidism 03/22/2019  ? Failure to thrive in adult 03/22/2019  ? Lipid disorder 03/22/2019  ? Essential hypertension 03/22/2019  ?  Depression 03/22/2019  ? Brief psychotic disorder ([place])   ? 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 ?PCP:  Margretta Sidle, MD ?Pharmacy:   ?[facility], [place] ?[address] [place] [address] ?Phone: [phone] Fax: [phone] ? ?Readmission Risk Interventions ?   ? View : No data to display.  ?  ?  ?  ? ? ?

## 2021-12-19 NOTE — Progress Notes (Addendum)
[facility] ? ?PROGRESS NOTE ? ?Sarah Acevedo WSF:[id number] DOB: 03/25/1933 DOA: 12/16/2021 ?PCP: Margretta Sidle, MD ? ? ?Brief HPI:   ?88 year old female with medical history for dementia with behavior disturbance, dysphagia, essential hypertension, hyperlipidemia, CKD stage IIIa, hypothyroidism presented to the ED via EMS after an unwitnessed fall.  Patient has been having hallucinations and her dose of olanzapine was adjusted by her psychiatrist.  Upon presentation in the ED work-up revealed acute displaced externally rotated right subcapital femoral neck fracture.  CT head and CT cervical spine were unremarkable.  ED provider discussed with orthopedic surgery, who recommended surgery today. ? ? ? ?Subjective  ? ?Patient seen and examined, complains of chest pain/heartburn this morning.  EKG obtained was unremarkable.  Had another episode of chest pain after she swallowed her pills.  Maalox has been ordered. ? ? Assessment/Plan:  ? ?Acute displaced right subcapital femoral neck fracture ?-Orthopedic surgery consulted ?-Underwent right hip bipolar hemiarthroplasty for displaced femoral neck fracture ? ?Chest pain ?-Atypical, associated with swallowing pills ?-Continue Maalox as needed ?-Start Protonix 40 mg p.o. daily ? ?Leukocytosis ?-WBC 12.4 ?-Likely reactive from fracture ?-Chest x-ray unremarkable ? ?Acute hypoxemic respiratory failure ?-Patient was initially requiring requiring 3 L/min of oxygen via nasal cannula ?-Now requiring 1 L/min ?-She is 2.2 L positive fluid overload ?-Unclear etiology ?-Chest x-ray is unremarkable ?-Wean oxygen as tolerated, may need oxygen at discharge ? ?QTc prolongation ?-QTc interval prolonged on EKG ?-Keep potassium more than 2, magnesium more than 2 ?-Avoid QTc prolonging agents ? ?Hypertension ?-Blood pressure is soft ?-We will cut down Toprol-XL to 50 mg p.o. daily from tomorrow morning ? ?Dementia with behavioral disturbance ?-Continue home regimen ?-Aricept and  trazodone on hold due to QTc elongation ? ?Hypothyroidism ?-Continue Synthroid ? ?Physical disability with recurrent falls ?-PT consulted ? ? ? ? ?Medications ? ?  ? artificial tears   Right Eye TID  ? aspirin EC  325 mg Oral Q breakfast  ? cholecalciferol  2,000 Units Oral Daily  ? docusate sodium  100 mg Oral BID  ? donepezil  23 mg Oral QHS  ? enoxaparin (LOVENOX) injection  30 mg Subcutaneous Q24H  ? famotidine  20 mg Oral Daily  ? feeding supplement  237 mL Oral BID BM  ? levothyroxine  50 mcg Oral Q0600  ? mouth rinse  15 mL Mouth Rinse BID  ? melatonin  3 mg Oral QHS  ? metoprolol succinate  100 mg Oral Daily  ? OLANZapine  10 mg Oral QHS  ? pantoprazole  40 mg Oral Q1200  ? sertraline  100 mg Oral Daily  ? traZODone  100 mg Oral QHS  ? ? ? Data Reviewed:  ? ?CBG: ? ?Recent Labs  ?Lab 12/17/21 ?2022  ?GLUCAP 165*  ? ? ?SpO2: (!) 88 % (RN notified) ?O2 Flow Rate (L/min): 1 L/min  ? ? ?Vitals:  ? 12/19/21 0133 12/19/21 0500 12/19/21 1121 12/19/21 1500  ?BP: (!) 124/54 (!) 147/64 98/64 119/66  ?Pulse: 63 63 82 91  ?Resp:  20  20  ?Temp:  97.7 ?F (36.5 ?C)  98.4 ?F (36.9 ?C)  ?TempSrc:  Oral  Oral  ?SpO2: 95% 97% 98% (!) 88%  ?Weight:      ?Height:      ? ? ? ? ?Data Reviewed: ? ?Basic Metabolic Panel: ?Recent Labs  ?Lab 12/16/21 ?1805 12/18/21 ?0174 12/19/21 ?9449  ?NA 139 140 139  ?K 4.2 4.4 4.3  ?CL 107 107 109  ?CO2 25  26 22  ?GLUCOSE 124* 147* 103*  ?BUN 32* 22 25*  ?CREATININE 1.14* 0.98 1.02*  ?CALCIUM 8.8* 8.3* 8.0*  ? ? ?CBC: ?Recent Labs  ?Lab 12/16/21 ?1805 12/18/21 ?3295 12/19/21 ?1884 12/19/21 ?1660  ?WBC 12.4* 8.0 7.9 8.0  ?NEUTROABS 8.4*  --   --   --   ?HGB 13.1 10.0* 8.3* 8.8*  ?HCT 41.8 33.0* 27.1* 28.8*  ?MCV 86.0 89.2 89.1 89.2  ?PLT 300 206 180 184  ? ? ?LFT ?Recent Labs  ?Lab 12/18/21 ?6301  ?AST 21  ?ALT 12  ?ALKPHOS 73  ?BILITOT 0.8  ?PROT 5.6*  ?ALBUMIN 3.2*  ? ?  ?Antibiotics: ?Anti-infectives (From admission, onward)  ? ? Start     Dose/Rate Route Frequency Ordered Stop  ? 12/17/21 1600   ceFAZolin (ANCEF) IVPB 2g/100 mL premix       ? 2 g ?200 mL/hr over 30 Minutes Intravenous On call to O.R. 12/17/21 1551 12/17/21 1732  ? 12/17/21 1554  ceFAZolin (ANCEF) 2-4 GM/100ML-% IVPB       ?Note to Pharmacy: Hassie Bruce A: cabinet override  ?    12/17/21 1554 12/17/21 1807  ? ?  ? ? ? ?DVT prophylaxis: Lovenox ? ? ?Code Status: Full code ? ?Family Communication: Daughter at bedside ? ? ?CONSULTS orthopedics ? ? ?Objective  ? ? ?Physical Examination: ? ? ?General-appears in no acute distress ?Heart-S1-S2, regular, no murmur auscultated ?Lungs-clear to auscultation bilaterally, no wheezing or crackles auscultated ?Abdomen-soft, nontender, no organomegaly ?Extremities-no edema in the lower extremities ?Neuro-alert, oriented x3, no focal deficit noted ? ?Status is: Inpatient: Hip fracture ? ? ? ?  ? ? ? ? ? ?Oswald Hillock ?  ?[facility] ?If 7PM-7AM, please contact night-coverage at [email or web address], ?Office  [phone] ? ? ?12/19/2021, 3:19 PM  LOS: 3 days  ? ? ? ? ? ? ? ? ? ? ?  ?

## 2021-12-19 NOTE — TOC PASRR Note (Signed)
RE: Sarah Acevedo ?Date of Birth: Jan 26, 1933 ?Date: 12/19/2021 ?MUST ID: [id number] ? ? ? ?To Whom It May Concern: ? ? ?Please be advised that the above name patient will require a short-term nursing home stay--anticipated 30 days or less rehabilitation and strengthening. The plan is for return home. ?

## 2021-12-19 NOTE — Progress Notes (Addendum)
Physical Therapy Treatment ?Patient Details ?Name: Sarah Acevedo ?MRN: [id number] ?DOB: 1933/03/06 ?Today's Date: 12/19/2021 ? ? ?History of Present Illness Pt admitted from memory care s/p fall with R hip fx and now s/p R hemi-arthroplasty by posterior approach.  Pt with hx of dementia ? ?  ?PT Comments  ? ? Patient making good progress with mobility and was able to complete bed mobility, transfers, and gait with RW and Mod assist. VC's needed and assist to maintain posterior hip precautions throughout. PT able to stand for ~4 minutes with min assist to maintain static stand, UE's shaking from fatigue with significant reliance on RW for support, she ambulated ~16' this visit with Mod assist. Pt is eager to progress mobility and will continue to benefit from skilled PT interventions to progress mobility as able throughout stay.  ? ?  ?Recommendations for follow up therapy are one component of a multi-disciplinary discharge planning process, led by the attending physician.  Recommendations may be updated based on patient status, additional functional criteria and insurance authorization. ? ?Follow Up Recommendations ? Skilled nursing-short term rehab (<3 hours/day) ?  ?  ?Assistance Recommended at Discharge Frequent or constant Supervision/Assistance  ?Patient can return home with the following A lot of help with walking and/or transfers;A little help with bathing/dressing/bathroom;Assistance with cooking/housework;Assist for transportation;Help with stairs or ramp for entrance ?  ?Equipment Recommendations ? None recommended by PT  ?  ?Recommendations for Other Services   ? ? ?  ?Precautions / Restrictions Precautions ?Precautions: Fall;Posterior Hip ?Precaution Booklet Issued: Yes (comment) ?Precaution Comments: THP reviewed with pt and dtr ?Required Braces or Orthoses: Knee Immobilizer - Right ?Knee Immobilizer - Right: Other (comment) (to minimize risk of breaking posterior THP) ?Restrictions ?Weight Bearing  Restrictions: No ?RLE Weight Bearing: Weight bearing as tolerated  ?  ? ?Mobility ? Bed Mobility ?Overal bed mobility: Needs Assistance ?Bed Mobility: Supine to Sit ?  ?  ?Supine to sit: Mod assist, HOB elevated ?  ?  ?General bed mobility comments: Mod assist with cues to sequence reaching for bed rail, assist to bring Rt LE off EOB and maintain posterior hip precautions. ?  ? ?Transfers ?Overall transfer level: Needs assistance ?Equipment used: Rolling walker (2 wheels) ?Transfers: Sit to/from Stand ?Sit to Stand: Mod assist, From elevated surface ?  ?  ?  ?  ?  ?General transfer comment: bed slightly elevated, Mod assist to power up from EOB. Cues for hand placement and technique and manual assits to position Rt LE and maintain posterior hip precautions with rise. ?  ? ?Ambulation/Gait ?Ambulation/Gait assistance: Min assist, Mod assist, +2 safety/equipment ?Gait Distance (Feet): 16 Feet ?Assistive device: Rolling walker (2 wheels) ?Gait Pattern/deviations: Step-to pattern, Trunk flexed, Decreased stride length, Decreased stance time - right, Decreased weight shift to right ?Gait velocity: decr ?  ?  ?General Gait Details: VC's to maintain safe proximity to RW. No buckling noted on Rt LE. Pt required mod assist to steady and facilitate anterior weight shift for improved posture during gait. ? ? ?Stairs ?  ?  ?  ?  ?  ? ? ?Wheelchair Mobility ?  ? ?Modified Rankin (Stroke Patients Only) ?  ? ? ?  ?Balance Overall balance assessment: Needs assistance ?Sitting-balance support: No upper extremity supported, Feet supported ?Sitting balance-Leahy Scale: Fair ?  ?  ?Standing balance support: Bilateral upper extremity supported ?Standing balance-Leahy Scale: Poor ?Standing balance comment: pt reliant on AD and assist from therapist to maintain standing balance . pt stood extended  time for dressing change by RN. ?  ?  ?  ?  ?  ?  ?  ?  ?  ?  ?  ?  ? ?  ?Cognition Arousal/Alertness: Awake/alert ?Behavior During Therapy:  [facility] for tasks assessed/performed ?Overall Cognitive Status: Within Functional Limits for tasks assessed ?  ?  ?  ?  ?  ?  ?  ?  ?  ?  ?  ?  ?  ?  ?  ?  ?  ?  ?  ? ?  ?Exercises   ? ?  ?General Comments   ?  ?  ? ?Pertinent Vitals/Pain Pain Assessment ?Pain Assessment: Faces ?Faces Pain Scale: Hurts little more ?Pain Location: Rt hip ?Pain Descriptors / Indicators: Aching, Sore ?Pain Intervention(s): Limited activity within patient's tolerance, Monitored during session, Repositioned  ? ? ?Home Living   ?  ?  ?  ?  ?  ?  ?  ?  ?  ?   ?  ?Prior Function    ?  ?  ?   ? ?PT Goals (current goals can now be found in the care plan section) Acute Rehab PT Goals ?Patient Stated Goal: keep moving and get better ?PT Goal Formulation: With patient/family ?Time For Goal Achievement: 01/01/22 ?Potential to Achieve Goals: Good ?Progress towards PT goals: Progressing toward goals ? ?  ?Frequency ? ? ? Min 3X/week ? ? ? ?  ?PT Plan    ? ? ?Co-evaluation PT/OT/SLP Co-Evaluation/Treatment: Yes ?  ?  ?  ?  ? ?  ?AM-PAC PT "6 Clicks" Mobility   ?Outcome Measure ? Help needed turning from your back to your side while in a flat bed without using bedrails?: A Lot ?Help needed moving from lying on your back to sitting on the side of a flat bed without using bedrails?: A Lot ?Help needed moving to and from a bed to a chair (including a wheelchair)?: A Lot ?Help needed standing up from a chair using your arms (e.g., wheelchair or bedside chair)?: A Lot ?Help needed to walk in hospital room?: A Lot ?Help needed climbing 3-5 steps with a railing? : Total ?6 Click Score: 11 ? ?  ?End of Session Equipment Utilized During Treatment: Gait belt ?Activity Tolerance: Patient tolerated treatment well ?Patient left: in chair;with call bell/phone within reach;with chair alarm set;with family/visitor present ?Nurse Communication: Mobility status ?PT Visit Diagnosis: Muscle weakness (generalized) (M62.81);Difficulty in walking, not elsewhere classified  (R26.2);History of falling (Z91.81) ?  ? ? ?Time: 7356-7014 ?PT Time Calculation (min) (ACUTE ONLY): 23 min ? ?Charges:  $Gait Training: 8-22 mins ?$Therapeutic Activity: 8-22 mins          ?          ? ?Gwynneth Albright PT, DPT ?Acute Rehabilitation Services ?Office [phone] ?Pager [phone]  ? ? ?Jacques Navy ?12/19/2021, 3:34 PM ? ?

## 2021-12-20 LAB — CBC
HCT: 27.2 % — ABNORMAL LOW (ref 36.0–46.0)
Hemoglobin: 8.5 g/dL — ABNORMAL LOW (ref 12.0–15.0)
MCH: 27.8 pg (ref 26.0–34.0)
MCHC: 31.3 g/dL (ref 30.0–36.0)
MCV: 88.9 fL (ref 80.0–100.0)
Platelets: 193 10*3/uL (ref 150–400)
RBC: 3.06 MIL/uL — ABNORMAL LOW (ref 3.87–5.11)
RDW: 14.4 % (ref 11.5–15.5)
WBC: 6.5 10*3/uL (ref 4.0–10.5)
nRBC: 0 % (ref 0.0–0.2)

## 2021-12-20 NOTE — ED Provider Notes (Signed)
?[facility] ?Provider Note ? ? ?CSN: [id number] ?Arrival date & time: 12/16/21  1741 ? ?  ? ?History ? ?Chief Complaint  ?Patient presents with  ? Fall  ? ? ?Sarah Acevedo is a 88 y.o. female. ? ?Patient with history of dementia.  She fell and complains of right hip pain. ? ?The history is provided by the patient and a relative. No language interpreter was used.  ?Fall ?This is a new problem. The current episode started 6 to 12 hours ago. The problem occurs constantly. The problem has not changed since onset.Pertinent negatives include no chest pain, no abdominal pain and no headaches. The symptoms are aggravated by bending. Nothing relieves the symptoms. She has tried nothing for the symptoms. The treatment provided no relief.  ? ?  ? ?Home Medications ?Prior to Admission medications   ?Medication Sig Start Date End Date Taking? Authorizing Provider  ?ACETAMINOPHEN EXTRA STRENGTH 500 MG capsule Take 500 mg by mouth every 4 (four) hours as needed for pain. 08/22/21  Yes [provider]  ?albuterol (PROVENTIL) (2.5 MG/3ML) 0.083% nebulizer solution Take 2.5 mg by nebulization every 4 (four) hours as needed for wheezing (coughing). 10/17/21  Yes [provider]  ?aluminum-magnesium hydroxide-simethicone (MAALOX) 700-174-94 MG/5ML SUSP Take 30 mLs by mouth every 6 (six) hours as needed (for heartburn and indigestion).   Yes [provider]  ?aspirin (BAYER ASPIRIN) 325 MG tablet Take 1 tablet (325 mg total) by mouth daily. 12/17/21  Yes [name], MD  ?aspirin 81 MG chewable tablet Chew 81 mg by mouth daily.   Yes [provider]  ?Cholecalciferol (VITAMIN D3) 50 MCG (2000 UT) TABS Take 2,000 Units by mouth daily.   Yes [provider]  ?donepezil (ARICEPT) 23 MG TABS tablet Take 23 mg by mouth daily. 12/12/21  Yes [provider]  ?famotidine (PEPCID) 20 MG tablet Take 20 mg by mouth daily.   Yes [provider]   ?guaiFENesin (ROBITUSSIN) 100 MG/5ML liquid Take 100 mg by mouth every 4 (four) hours as needed for cough.   Yes [provider]  ?HYDROcodone-acetaminophen (NORCO/VICODIN) 5-325 MG tablet Take 1 tablet by mouth every 4 (four) hours as needed for moderate pain. 12/17/21 12/17/22 Yes [name], MD  ?hypromellose (GENTEAL) 0.3 % GEL ophthalmic ointment Place 1 application. into the right eye 3 (three) times daily.   Yes [provider]  ?levothyroxine (SYNTHROID, LEVOTHROID) 50 MCG tablet Take 50 mcg by mouth daily.   Yes [provider]  ?Melatonin 3 MG TABS Take 3 mg by mouth at bedtime. Take with '5mg'$  tablet for a total nightly dose of '8mg'$ .   Yes [provider]  ?metoprolol succinate (TOPROL-XL) 100 MG 24 hr tablet Take 100 mg by mouth daily. Take with or immediately following a meal.   Yes [provider]  ?OLANZapine (ZYPREXA) 20 MG tablet Take 10 mg by mouth at bedtime.   Yes [provider]  ?Pseudoephedrine-Guaifenesin (MUCINEX D MAX STRENGTH) 579-057-2019 MG TB12 Take 1 tablet by mouth 2 (two) times daily as needed (for cough and congestion).   Yes [provider]  ?sertraline (ZOLOFT) 100 MG tablet Take 100 mg by mouth daily.   Yes [provider]  ?traZODone (DESYREL) 100 MG tablet Take 100 mg by mouth at bedtime.   Yes [provider]  ?   ? ?Allergies    ?Contrast media [iodinated contrast media] and Iodine   ? ?Review  of Systems   ?Review of Systems  ?Constitutional:  Negative for appetite change and fatigue.  ?HENT:  Negative for congestion, ear discharge and sinus pressure.   ?Eyes:  Negative for discharge.  ?Respiratory:  Negative for cough.   ?Cardiovascular:  Negative for chest pain.  ?Gastrointestinal:  Negative for abdominal pain and diarrhea.  ?Genitourinary:  Negative for frequency and hematuria.  ?Musculoskeletal:  Negative for back pain.  ?     Right hip pain  ?Skin:  Negative for rash.  ?Neurological:  Negative  for seizures and headaches.  ?Psychiatric/Behavioral:  Negative for hallucinations.   ? ?Physical Exam ?Updated Vital Signs ?BP 136/61 (BP Location: Left Arm)   Pulse 64   Temp (!) 97.4 ?F (36.3 ?C) (Oral)   Resp 20   Ht '5\' 1"'$  (1.549 m)   Wt 52.5 kg   SpO2 97%   BMI 21.88 kg/m?  ?Physical Exam ?Vitals and nursing note reviewed.  ?Constitutional:   ?   Appearance: She is well-developed.  ?HENT:  ?   Head: Normocephalic.  ?   Mouth/Throat:  ?   Mouth: Mucous membranes are moist.  ?Eyes:  ?   General: No scleral icterus. ?   Conjunctiva/sclera: Conjunctivae normal.  ?Neck:  ?   Thyroid: No thyromegaly.  ?Cardiovascular:  ?   Rate and Rhythm: Normal rate and regular rhythm.  ?   Heart sounds: No murmur heard. ?  No friction rub. No gallop.  ?Pulmonary:  ?   Breath sounds: No stridor. No wheezing or rales.  ?Chest:  ?   Chest wall: No tenderness.  ?Abdominal:  ?   General: There is no distension.  ?   Tenderness: There is no abdominal tenderness. There is no rebound.  ?Musculoskeletal:  ?   Cervical back: Neck supple.  ?   Comments: Tender deformed right hip  ?Lymphadenopathy:  ?   Cervical: No cervical adenopathy.  ?Skin: ?   Findings: No erythema or rash.  ?Neurological:  ?   Mental Status: She is alert.  ?   Motor: No abnormal muscle tone.  ?   Coordination: Coordination normal.  ?   Comments: Oriented to person only  ?Psychiatric:     ?   Behavior: Behavior normal.  ? ? ?ED Results / Procedures / Treatments   ?Labs ?(all labs ordered are listed, but only abnormal results are displayed) ?Labs Reviewed  ?CBC WITH DIFFERENTIAL/PLATELET - Abnormal; Notable for the following components:  ?    Result Value  ? WBC 12.4 (*)   ? Neutro Abs 8.4 (*)   ? Abs Immature Granulocytes 0.14 (*)   ? All other components within normal limits  ?BASIC METABOLIC PANEL - Abnormal; Notable for the following components:  ? Glucose, Bld 124 (*)   ? BUN 32 (*)   ? Creatinine, Ser 1.14 (*)   ? Calcium 8.8 (*)   ? GFR, Estimated 47 (*)   ?  All other components within normal limits  ?URINALYSIS, ROUTINE W REFLEX MICROSCOPIC - Abnormal; Notable for the following components:  ? APPearance HAZY (*)   ? All other components within normal limits  ?CBC - Abnormal; Notable for the following components:  ? RBC 3.70 (*)   ? Hemoglobin 10.0 (*)   ? HCT 33.0 (*)   ? All other components within normal limits  ?COMPREHENSIVE METABOLIC PANEL - Abnormal; Notable for the following components:  ? Glucose, Bld 147 (*)   ? Calcium 8.3 (*)   ? Total  Protein 5.6 (*)   ? Albumin 3.2 (*)   ? GFR, Estimated 56 (*)   ? All other components within normal limits  ?GLUCOSE, CAPILLARY - Abnormal; Notable for the following components:  ? Glucose-Capillary 165 (*)   ? All other components within normal limits  ?CBC - Abnormal; Notable for the following components:  ? RBC 3.23 (*)   ? Hemoglobin 8.8 (*)   ? HCT 28.8 (*)   ? All other components within normal limits  ?BASIC METABOLIC PANEL - Abnormal; Notable for the following components:  ? Glucose, Bld 103 (*)   ? BUN 25 (*)   ? Creatinine, Ser 1.02 (*)   ? Calcium 8.0 (*)   ? GFR, Estimated 54 (*)   ? All other components within normal limits  ?CBC - Abnormal; Notable for the following components:  ? RBC 3.04 (*)   ? Hemoglobin 8.3 (*)   ? HCT 27.1 (*)   ? All other components within normal limits  ?CBC - Abnormal; Notable for the following components:  ? RBC 3.06 (*)   ? Hemoglobin 8.5 (*)   ? HCT 27.2 (*)   ? All other components within normal limits  ?LACTIC ACID, PLASMA  ?LACTIC ACID, PLASMA  ? ? ?EKG ?EKG Interpretation ? ?Date/Time:  Friday December 16 2021 17:56:26 EDT ?Ventricular Rate:  71 ?PR Interval:  178 ?QRS Duration: 96 ?QT Interval:  453 ?QTC Calculation: 500 ?R Axis:   65 ?Text Interpretation: Sinus rhythm Borderline prolonged QT interval Confirmed by Sherwood Gambler [phone]) on 12/17/2021 8:28:36 AM ? ?Radiology ?No results found. ? ?Procedures ?Procedures  ? ? ?Medications Ordered in ED ?Medications  ?levothyroxine  (SYNTHROID) tablet 50 mcg (50 mcg Oral Given 12/20/21 0518)  ?melatonin tablet 3 mg (3 mg Oral Given 12/19/21 2246)  ?sertraline (ZOLOFT) tablet 100 mg (100 mg Oral Given 12/20/21 1054)  ?prochlorperazine (COMPAZINE)

## 2021-12-20 NOTE — Progress Notes (Signed)
SATURATION QUALIFICATIONS: (This note is used to comply with regulatory documentation for home oxygen) ? ?Patient Saturations on Room Air at Rest = 98% ? ?Patient Saturations on Room Air while Ambulating = 94% ? ?Patient Saturations on 0 Liters of oxygen while Ambulating = 97% ? ?Please briefly explain why patient needs home oxygen: ?

## 2021-12-20 NOTE — Care Management Important Message (Signed)
Important Message ? ?Patient Details IM Letter placed in Patients room. ?Name: Sarah Acevedo ?MRN: [id number] ?Date of Birth: Apr 28, 1933 ? ? ?Medicare Important Message Given:  Yes ? ? ? ? ?Kerin Salen ?12/20/2021, 2:41 PM ?

## 2021-12-20 NOTE — TOC Progression Note (Signed)
Transition of Care (TOC) - Progression Note  ? ?Patient Details  ?Name: Sarah Acevedo ?MRN: [id number] ?Date of Birth: September 10, 1932 ? ?Transition of Care (TOC) CM/SW Contact  ?Sherie Don, LCSW ?Phone Number: ?12/20/2021, 2:46 PM ? ?Clinical Narrative: Patient received a bed offer from [facility], which daughter has accepted. CSW confirmed bed with Elyse Hsu in admissions. CSW started insurance authorization in the [facility] portal, which is still pending. Plan auth ID is [id number] and reference ID # is [id number]. CSW updated hospitalist and daughter regarding the pending authorization. Elyse Hsu reported patient can be admitted tomorrow pending insurance approval. ? ?Expected Discharge Plan: [place] ?Barriers to Discharge: SNF Pending bed offer, Insurance Authorization ? ?Expected Discharge Plan and Services ?Expected Discharge Plan: [place] ?In-house Referral: Clinical Social Work ?Post Acute Care Choice: [place] ?Living arrangements for the past 2 months: [place]              ?DME Arranged: N/A ?DME Agency: NA ? ?Readmission Risk Interventions ? ?  12/20/2021  ? 10:44 AM  ?Readmission Risk Prevention Plan  ?Transportation Screening Complete  ?PCP or Specialist Appt within 3-5 Days Not Complete  ?Not Complete comments Patient will discharge to SNF.  ?[place] or Home Care Consult Complete  ?Social Work Consult for [place] Planning/Counseling Complete  ?Palliative Care Screening Not Applicable  ?Medication Review Press photographer) Complete  ? ?

## 2021-12-20 NOTE — Progress Notes (Addendum)
[facility] ? ?PROGRESS NOTE ? ?Sarah Acevedo NFA:[id number] DOB: 11-19-1932 DOA: 12/16/2021 ?PCP: Margretta Sidle, MD ? ? ?Brief HPI:   ?88 year old female with medical history for dementia with behavior disturbance, dysphagia, essential hypertension, hyperlipidemia, CKD stage IIIa, hypothyroidism presented to the ED via EMS after an unwitnessed fall.  Patient has been having hallucinations and her dose of olanzapine was adjusted by her psychiatrist.  Upon presentation in the ED work-up revealed acute displaced externally rotated right subcapital femoral neck fracture.  CT head and CT cervical spine were unremarkable.  ED provider discussed with orthopedic surgery, who recommended surgery today. ? ? ? ?Subjective  ? ?Patient seen and examined, ambulating pulse oximetry showed O2 sats 97% on room air ? ? Assessment/Plan:  ? ?Acute displaced right subcapital femoral neck fracture ?-Orthopedic surgery consulted ?-Underwent right hip bipolar hemiarthroplasty for displaced femoral neck fracture ? ?Chest pain ?-Atypical, associated with swallowing pills ?-Continue Maalox as needed ?-Started on  Protonix 40 mg p.o. daily ? ?Leukocytosis ?-WBC 12.4 ?-Likely reactive from fracture ?-Chest x-ray unremarkable ? ?Acute hypoxemic respiratory failure ?-Resolved ?-Patient was initially requiring requiring 3 L/min of oxygen via nasal cannula ?-Now requiring 1 L/min ?-She is 2.2 L positive fluid overload ?-Likely from fluid overload ?-Chest x-ray is unremarkable ?-Oxygen has been weaned off to room air ? ?QTc prolongation ?-QTc interval prolonged on EKG ?-Keep potassium more than 2, magnesium more than 2 ?-Avoid QTc prolonging agents ? ?Hypertension ?-Blood pressure is soft ?-Toprol-XL was changed to 50 mg p.o. daily  ? ?Dementia with behavioral disturbance ?-Continue home regimen ?-Aricept and trazodone on hold due to QTc prolongation ? ?Hypothyroidism ?-Continue Synthroid ? ?Physical disability with recurrent falls ?-PT  consulted ? ? ? ? ?Medications ? ?  ? artificial tears   Right Eye TID  ? aspirin EC  325 mg Oral Q breakfast  ? cholecalciferol  2,000 Units Oral Daily  ? docusate sodium  100 mg Oral BID  ? donepezil  23 mg Oral QHS  ? famotidine  20 mg Oral Daily  ? feeding supplement  237 mL Oral BID BM  ? levothyroxine  50 mcg Oral Q0600  ? mouth rinse  15 mL Mouth Rinse BID  ? melatonin  3 mg Oral QHS  ? metoprolol succinate  50 mg Oral Daily  ? OLANZapine  15 mg Oral QHS  ? pantoprazole  40 mg Oral Q1200  ? sertraline  100 mg Oral Daily  ? traZODone  100 mg Oral QHS  ? ? ? Data Reviewed:  ? ?CBG: ? ?Recent Labs  ?Lab 12/17/21 ?2022  ?GLUCAP 165*  ? ? ?SpO2: 98 % ?O2 Flow Rate (L/min): 1 L/min  ? ? ?Vitals:  ? 12/19/21 1500 12/19/21 2000 12/20/21 0525 12/20/21 1225  ?BP: 119/66 108/62 136/61 129/64  ?Pulse: 91 98 64   ?Resp: '20 20 20   '$ ?Temp: 98.4 ?F (36.9 ?C) 98.6 ?F (37 ?C) (!) 97.4 ?F (36.3 ?C) 98.1 ?F (36.7 ?C)  ?TempSrc: Oral Oral Oral Oral  ?SpO2: (!) 88% 91% 97% 98%  ?Weight:      ?Height:      ? ? ? ? ?Data Reviewed: ? ?Basic Metabolic Panel: ?Recent Labs  ?Lab 12/16/21 ?1805 12/18/21 ?4696 12/19/21 ?2952  ?NA 139 140 139  ?K 4.2 4.4 4.3  ?CL 107 107 109  ?CO2 '25 26 22  '$ ?GLUCOSE 124* 147* 103*  ?BUN 32* 22 25*  ?CREATININE 1.14* 0.98 1.02*  ?CALCIUM 8.8* 8.3* 8.0*  ? ? ?CBC: ?  Recent Labs  ?Lab 12/16/21 ?1805 12/18/21 ?5056 12/19/21 ?0052 12/19/21 ?9794 12/20/21 ?8016  ?WBC 12.4* 8.0 7.9 8.0 6.5  ?NEUTROABS 8.4*  --   --   --   --   ?HGB 13.1 10.0* 8.3* 8.8* 8.5*  ?HCT 41.8 33.0* 27.1* 28.8* 27.2*  ?MCV 86.0 89.2 89.1 89.2 88.9  ?PLT 300 206 180 184 193  ? ? ?LFT ?Recent Labs  ?Lab 12/18/21 ?5537  ?AST 21  ?ALT 12  ?ALKPHOS 73  ?BILITOT 0.8  ?PROT 5.6*  ?ALBUMIN 3.2*  ? ?  ?Antibiotics: ?Anti-infectives (From admission, onward)  ? ? Start     Dose/Rate Route Frequency Ordered Stop  ? 12/17/21 1600  ceFAZolin (ANCEF) IVPB 2g/100 mL premix       ? 2 g ?200 mL/hr over 30 Minutes Intravenous On call to O.R. 12/17/21 1551  12/17/21 1732  ? 12/17/21 1554  ceFAZolin (ANCEF) 2-4 GM/100ML-% IVPB       ?Note to Pharmacy: Hassie Bruce A: cabinet override  ?    12/17/21 1554 12/17/21 1807  ? ?  ? ? ? ?DVT prophylaxis: Lovenox ? ? ?Code Status: Full code ? ?Family Communication: Daughter at bedside ? ? ?CONSULTS orthopedics ? ? ?Objective  ? ? ?Physical Examination: ? ? ?General-appears in no acute distress ?Heart-S1-S2, regular, no murmur auscultated ?Lungs-clear to auscultation bilaterally, no wheezing or crackles auscultated ?Abdomen-soft, nontender, no organomegaly ?Extremities-no edema in the lower extremities ?Neuro-alert, oriented x3, no focal deficit noted ? ?Status is: Inpatient: Hip fracture ? ? ? ?  ? ? ?Sarah Acevedo ?  ?[facility] ?If 7PM-7AM, please contact night-coverage at [email or web address], ?Office  [phone] ? ? ?12/20/2021, 2:09 PM  LOS: 4 days  ? ? ? ? ? ? ? ? ? ? ?  ?

## 2021-12-21 DIAGNOSIS — S72001S Fracture of unspecified part of neck of right femur, sequela: Secondary | ICD-10-CM

## 2021-12-21 DIAGNOSIS — K219 Gastro-esophageal reflux disease without esophagitis: Secondary | ICD-10-CM

## 2021-12-21 MED ORDER — OLANZAPINE 15 MG PO TABS: 15.0000 mg | ORAL_TABLET | Freq: Every day | ORAL | 3 refills | Status: AC

## 2021-12-21 MED ORDER — PANTOPRAZOLE SODIUM 40 MG PO TBEC: 40.0000 mg | DELAYED_RELEASE_TABLET | Freq: Every day | ORAL | Status: AC

## 2021-12-21 MED ORDER — METOPROLOL SUCCINATE ER 50 MG PO TB24: 50.0000 mg | ORAL_TABLET | Freq: Every day | ORAL | 3 refills | Status: AC

## 2021-12-21 NOTE — Plan of Care (Signed)
No acute events overnight. ?Problem: Education: ?Goal: Knowledge of General Education information will improve ?Description: Including pain rating scale, medication(s)/side effects and non-pharmacologic comfort measures ?Outcome: Progressing ?  ?Problem: Health Behavior/Discharge Planning: ?Goal: Ability to manage health-related needs will improve ?Outcome: Progressing ?  ?Problem: Clinical Measurements: ?Goal: Ability to maintain clinical measurements within normal limits will improve ?Outcome: Progressing ?Goal: Will remain free from infection ?Outcome: Progressing ?Goal: Diagnostic test results will improve ?Outcome: Progressing ?Goal: Respiratory complications will improve ?Outcome: Progressing ?Goal: Cardiovascular complication will be avoided ?Outcome: Progressing ?  ?Problem: Activity: ?Goal: Risk for activity intolerance will decrease ?Outcome: Progressing ?  ?Problem: Nutrition: ?Goal: Adequate nutrition will be maintained ?Outcome: Progressing ?  ?Problem: Coping: ?Goal: Level of anxiety will decrease ?Outcome: Progressing ?  ?Problem: Elimination: ?Goal: Will not experience complications related to bowel motility ?Outcome: Progressing ?Goal: Will not experience complications related to urinary retention ?Outcome: Progressing ?  ?Problem: Pain Managment: ?Goal: General experience of comfort will improve ?Outcome: Progressing ?  ?Problem: Safety: ?Goal: Ability to remain free from injury will improve ?Outcome: Progressing ?  ?Problem: Skin Integrity: ?Goal: Risk for impaired skin integrity will decrease ?Outcome: Progressing ?  ?Problem: Education: ?Goal: Verbalization of understanding the information provided (i.e., activity precautions, restrictions, etc) will improve ?Outcome: Progressing ?Goal: Individualized Educational Video(s) ?Outcome: Progressing ?  ?Problem: Activity: ?Goal: Ability to ambulate and perform ADLs will improve ?Outcome: Progressing ?  ?Problem: Clinical Measurements: ?Goal:  Postoperative complications will be avoided or minimized ?Outcome: Progressing ?  ?Problem: Self-Concept: ?Goal: Ability to maintain and perform role responsibilities to the fullest extent possible will improve ?Outcome: Progressing ?  ?Problem: Pain Management: ?Goal: Pain level will decrease ?Outcome: Progressing ?  ?

## 2021-12-21 NOTE — TOC Transition Note (Addendum)
Transition of Care (TOC) - CM/SW Discharge Note ? ? ?Patient Details  ?Name: Sarah Acevedo ?MRN: [id number] ?Date of Birth: [birth year] ? ?Transition of Care (TOC) CM/SW Contact:  ?Tawanna Cooler, RN ?Phone Number: ?12/21/2021, 10:59 AM ? ? ?Clinical Narrative:    ? ?Auth received for patient to go to SNF rehab at [facility].  Spoke with rep Elyse Hsu, gave her auth info: Plan auth ID [id number], reference ID [id number].  Approved for 4/18-4/20.   ? ?Elyse Hsu states patient will be going to room 37.  Number to call report is [phone].   ? ?CM called patient's daughter, Jenny Reichmann, to let her know.   ? ?PTAR called at [phone].   ? ? ? ?Final next level of care: [place] ?Barriers to Discharge: Barriers Resolved ? ? ?Patient Goals and CMS Choice ?Patient states their goals for this hospitalization and ongoing recovery are:: Go to short-term rehab ?CMS Medicare.gov Compare Post Acute Care list provided to:: Patient Represenative (must comment) ?Choice offered to / list presented to : Adult Children ? ?Discharge Placement ?  ?           ?Patient chooses bed at: [facility] ?Patient to be transferred to facility by: PTAR ?Name of family member notified: Daughter, Jenny Reichmann ?Patient and family notified of of transfer: 12/21/21 ? ?Discharge Plan and Services ?In-house Referral: Clinical Social Work ?  ?Post Acute Care Choice: [place]          ?DME Arranged: N/A ?DME Agency: NA ?  ?  ?  ?  ?Readmission Risk Interventions ? ?  12/20/2021  ? 10:44 AM  ?Readmission Risk Prevention Plan  ?Transportation Screening Complete  ?PCP or Specialist Appt within 3-5 Days Not Complete  ?Not Complete comments Patient will discharge to SNF.  ?[place] or Home Care Consult Complete  ?Social Work Consult for [place] Planning/Counseling Complete  ?Palliative Care Screening Not Applicable  ?Medication Review Press photographer) Complete  ? ? ? ? ? ?

## 2021-12-21 NOTE — Discharge Summary (Signed)
?Physician Discharge Summary ?  ?Patient: Sarah Acevedo MRN: [id number] DOB: February 09, 1933  ?Admit date:     12/16/2021  ?Discharge date: 12/21/21  ?Discharge Physician: Oswald Hillock  ? ?PCP: Margretta Sidle, MD  ? ?Recommendations at discharge:  ? ?Follow-up orthopedic surgery Dr. Lorin Mercy in 2 weeks  ? ?Discharge Diagnoses: ?Principal Problem: ?  Closed right hip fracture ([place]) ? ?Resolved Problems: ?  * No resolved hospital problems. * ? ?Hospital Course: ? ?88 year old female with medical history for dementia with behavior disturbance, dysphagia, essential hypertension, hyperlipidemia, CKD stage IIIa, hypothyroidism presented to the ED via EMS after an unwitnessed fall.  Patient has been having hallucinations and her dose of olanzapine was adjusted by her psychiatrist.  Upon presentation in the ED work-up revealed acute displaced externally rotated right subcapital femoral neck fracture.  CT head and CT cervical spine were unremarkable.  ED provider discussed with orthopedic surgery, who recommended surgery today. ? ?Assessment and Plan: ? ?Acute displaced right subcapital femoral neck fracture ?-Orthopedic surgery consulted ?-Underwent right hip bipolar hemiarthroplasty for displaced femoral neck fracture ?-Patient will be discharged to skilled nursing facility for rehab ?-Follow-up orthopedic surgery in 2 weeks acute ?  ?Chest pain ?-Atypical, associated with swallowing pills ?-Continue Maalox as needed ?-Started on  Protonix 40 mg p.o. daily ? ?Acute blood loss anemia ?-Hemoglobin 8.5, stable ?  ?Leukocytosis ?-WBC 12.4 ?-Likely reactive from fracture ?-Chest x-ray unremarkable ?  ?Acute hypoxemic respiratory failure ?-Resolved ?-Patient was initially requiring requiring 3 L/min of oxygen via nasal cannula ?-Now requiring 1 L/min ?-She is 2.2 L positive fluid overload ?-Likely from fluid overload ?-Chest x-ray is unremarkable ?-Oxygen has been weaned off to room air ?  ?QTc prolongation ?-QTc interval prolonged on  EKG ?-Keep potassium more than 2, magnesium more than 2 ?-Avoid QTc prolonging agents ?  ?Hypertension ?-Blood pressure is soft ?-Toprol-XL was changed to 50 mg p.o. daily  ?  ?Dementia with behavioral disturbance ?-Continue home regimen ?-Dose of olanzapine changed to 15 mg p.o. daily ?  ?Hypothyroidism ?-Continue Synthroid ?  ?Patient to be discharged to skilled nursing facility for rehab. ? ? ?  ? ? ?Consultants: Orthopedic surgery ?Procedures performed: Right hip hemiarthroplasty ?Disposition: Skilled nursing facility ?Diet recommendation:  ?Discharge Diet Orders (From admission, onward)  ? ?  Start     Ordered  ? 12/21/21 0000  Diet - low sodium heart healthy       ? 12/21/21 1106  ? ?  ?  ? ?  ? ?Regular diet ?DISCHARGE MEDICATION: ?Allergies as of 12/21/2021   ? ?   Reactions  ? Contrast Media [iodinated Contrast Media] Hives  ? Not listed on MAR  ? Iodine Hives  ? Per MAR  ? ?  ? ?  ?Medication List  ?  ? ?STOP taking these medications   ? ?Acetaminophen Extra Strength 500 MG capsule ?Generic drug: Acetaminophen ?  ?aspirin 81 MG chewable tablet ?Replaced by: aspirin 325 MG tablet ?  ? ?  ? ?TAKE these medications   ? ?albuterol (2.5 MG/3ML) 0.083% nebulizer solution ?Commonly known as: PROVENTIL ?Take 2.5 mg by nebulization every 4 (four) hours as needed for wheezing (coughing). ?  ?aluminum-magnesium hydroxide-simethicone 200-200-20 MG/5ML Susp ?Commonly known as: MAALOX ?Take 30 mLs by mouth every 6 (six) hours as needed (for heartburn and indigestion). ?  ?aspirin 325 MG tablet ?Commonly known as: Bayer Aspirin ?Take 1 tablet (325 mg total) by mouth daily. ?Replaces: aspirin 81 MG chewable tablet ?  ?donepezil  23 MG Tabs tablet ?Commonly known as: ARICEPT ?Take 23 mg by mouth daily. ?  ?famotidine 20 MG tablet ?Commonly known as: PEPCID ?Take 20 mg by mouth daily. ?  ?guaiFENesin 100 MG/5ML liquid ?Commonly known as: ROBITUSSIN ?Take 100 mg by mouth every 4 (four) hours as needed for cough. ?   ?HYDROcodone-acetaminophen 5-325 MG tablet ?Commonly known as: NORCO/VICODIN ?Take 1 tablet by mouth every 4 (four) hours as needed for moderate pain. ?  ?hypromellose 0.3 % Gel ophthalmic ointment ?Commonly known as: GENTEAL ?Place 1 application. into the right eye 3 (three) times daily. ?  ?levothyroxine 50 MCG tablet ?Commonly known as: SYNTHROID ?Take 50 mcg by mouth daily. ?  ?melatonin 3 MG Tabs tablet ?Take 3 mg by mouth at bedtime. Take with '5mg'$  tablet for a total nightly dose of '8mg'$ . ?  ?metoprolol succinate 50 MG 24 hr tablet ?Commonly known as: TOPROL-XL ?Take 1 tablet (50 mg total) by mouth daily. Take with or immediately following a meal. ?Start taking on: December 22, 2021 ?What changed:  ?medication strength ?how much to take ?  ?Mucinex D Max Strength [phone] MG Tb12 ?Generic drug: Pseudoephedrine-Guaifenesin ?Take 1 tablet by mouth 2 (two) times daily as needed (for cough and congestion). ?  ?OLANZapine 15 MG tablet ?Commonly known as: ZYPREXA ?Take 1 tablet (15 mg total) by mouth at bedtime. ?What changed:  ?medication strength ?how much to take ?  ?pantoprazole 40 MG tablet ?Commonly known as: PROTONIX ?Take 1 tablet (40 mg total) by mouth daily at 12 noon. ?  ?sertraline 100 MG tablet ?Commonly known as: ZOLOFT ?Take 100 mg by mouth daily. ?  ?traZODone 100 MG tablet ?Commonly known as: DESYREL ?Take 100 mg by mouth at bedtime. ?  ?Vitamin D3 50 MCG (2000 UT) Tabs ?Take 2,000 Units by mouth daily. ?  ? ?  ? ?  ?  ? ? ?  ?Discharge Care Instructions  ?(From admission, onward)  ?  ? ? ?  ? ?  Start     Ordered  ? 12/21/21 0000  Leave dressing on - Keep it clean, dry, and intact until clinic visit       ? 12/21/21 1106  ? 12/17/21 0000  Weight bearing as tolerated       ? 12/17/21 1846  ? ?  ?  ? ?  ? ? Contact information for follow-up providers   ? ? [name], MD Follow up in 2 week(s).   ?Specialty: Orthopedic Surgery ?Contact information: ?[address] ?[place]  [address] ?[phone] ? ? ?  ?  ? ?  ?  ? ? Contact information for after-discharge care   ? ? Destination   ? ? [facility] .   ?Service: Skilled Nursing ?Contact information: ?[address] ?[place] ?[phone] ? ?  ?  ? ?  ?  ? ?  ?  ? ?  ? ?Discharge Exam: ?Filed Weights  ? 12/16/21 2250  ?Weight: 52.5 kg  ? ?General-appears in no acute distress ?Heart-S1-S2, regular, no murmur auscultated ?Lungs-clear to auscultation bilaterally, no wheezing or crackles auscultated ?Abdomen-soft, nontender, no organomegaly ?Extremities-no edema in the lower extremities ?Neuro-alert, oriented x3, no focal deficit noted ? ?Condition at discharge: good ? ?The results of significant diagnostics from this hospitalization (including imaging, microbiology, ancillary and laboratory) are listed below for reference.  ? ?Imaging Studies: ?CT Head Wo Contrast ? ?Result Date: 12/16/2021 ?CLINICAL DATA:  Un  witnessed fall EXAM: CT HEAD WITHOUT CONTRAST TECHNIQUE: Contiguous axial images were obtained from the base of the skull through the vertex without intravenous contrast. RADIATION DOSE REDUCTION: This exam was performed according to the departmental dose-optimization program which includes automated exposure control, adjustment of the mA and/or kV according to patient size and/or use of iterative reconstruction technique. COMPARISON:  07/21/2021 FINDINGS: Brain: Scattered hypodensities throughout the periventricular white matter and bilateral basal ganglia are again noted, consistent with stable chronic small vessel ischemic change. No evidence of acute infarct or hemorrhage. Lateral ventricles are unremarkable. 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 Previously this measured approximately 3.2 x 2.4 by 2.4 cm by my measurement. No acute extra-axial fluid collections. Vascular:  Stable atherosclerosis. Skull: Minimal right parietal scalp hematoma. No underlying fracture. The remainder of the calvarium is unremarkable. Sinuses/Orbits: No acute finding. Other: None. IMPRESSION: 1. No acute infarct or hemorrhage.

## 2022-01-04 ENCOUNTER — Encounter: Payer: Self-pay | Admitting: Orthopaedic Surgery

## 2022-01-04 ENCOUNTER — Ambulatory Visit (INDEPENDENT_AMBULATORY_CARE_PROVIDER_SITE_OTHER): Payer: Medicare Other | Admitting: Orthopaedic Surgery

## 2022-01-04 ENCOUNTER — Ambulatory Visit: Payer: Self-pay

## 2022-01-04 VITALS — BP 126/64 | HR 71 | Ht 61.0 in | Wt 115.0 lb

## 2022-01-04 DIAGNOSIS — M25551 Pain in right hip: Secondary | ICD-10-CM

## 2022-01-04 NOTE — Progress Notes (Signed)
? ?  Post-Op Visit Note ?  ?Patient: Everlene Other           ?Date of Birth: [birth year]           ?MRN: [id number] ?Visit Date: 01/04/2022 ?PCP: Margretta Sidle, MD ? ? ?Assessment & Plan: Postop bipolar hemiarthroplasty she is amatory using a walker with therapy.  Daughter is present with her today.  Incision looks good staples are harvested.  Continue progressive distance ambulation weightbearing as tolerated full weightbearing with her walker.  She can follow-up if needed. ? ?Chief Complaint:  ?Chief Complaint  ?Patient presents with  ? Right Hip - Routine Post Op  ?  12/17/2021 right hip hemiarthroplasty  ? ?Visit Diagnoses:  ?1. Pain in right hip   ? ? ?Plan: Continue weightbearing as tolerated.  Walker for fall prevention.  Return as needed. ? ?Follow-Up Instructions: No follow-ups on file.  ? ?Orders:  ?Orders Placed This Encounter  ?Procedures  ? XR HIP UNILAT W OR W/O PELVIS 2-3 VIEWS RIGHT  ? ?No orders of the defined types were placed in this encounter. ? ? ?Imaging: ?No results found. ? ?PMFS History: ?Patient Active Problem List  ? Diagnosis Date Noted  ? Closed right hip fracture ([place]) 12/16/2021  ? UTI (urinary tract infection) 03/23/2019  ? AKI (acute kidney injury) ([place]) 03/22/2019  ? Hypothyroidism 03/22/2019  ? Failure to thrive in adult 03/22/2019  ? Lipid disorder 03/22/2019  ? Essential hypertension 03/22/2019  ? Depression 03/22/2019  ? Brief psychotic disorder ([place])   ? 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 ?Past Medical History:  ?Diagnosis Date  ? Anxiety   ? Basal cell carcinoma   ? Bone spur   ? Cancer [facility])   ? Depression   ? Hypertension   ? Hypothyroidism   ? Ruptured disk   ?  ?No family history on file.  ?Past Surgical History:  ?Procedure Laterality Date  ? CESAREAN SECTION    ? HIP ARTHROPLASTY Right 12/17/2021  ? Procedure: ARTHROPLASTY BIPOLAR HIP (HEMIARTHROPLASTY);  Surgeon: [name], MD;  Location: [facility];  Service: Orthopedics;  Laterality: Right;   ? TMJ ARTHROPLASTY    ? TONSILLECTOMY    ? ?Social History  ? ?Occupational History  ? Not on file  ?Tobacco Use  ? Smoking status: Former  ? Smokeless tobacco: Never  ?Substance and Sexual Activity  ? Alcohol use: No  ? Drug use: No  ? Sexual activity: Never  ? ? ? ?

## 2022-02-03 ENCOUNTER — Ambulatory Visit: Payer: Medicare Other | Admitting: Surgical

## 2023-04-05 DEATH — deceased
